# Patient Record
Sex: Female | Born: 1985 | Race: Black or African American | Hispanic: No | Marital: Single | Smoking: Never smoker
Health system: Southern US, Community
[De-identification: ages and names within clinical notes are randomized; demographics above are authoritative.]

## PROBLEM LIST (undated history)

## (undated) DIAGNOSIS — F29 Unspecified psychosis not due to a substance or known physiological condition: Secondary | ICD-10-CM

## (undated) DIAGNOSIS — F1410 Cocaine abuse, uncomplicated: Secondary | ICD-10-CM

## (undated) DIAGNOSIS — T1491XA Suicide attempt, initial encounter: Secondary | ICD-10-CM

---

## 2006-11-29 HISTORY — PX: EXPLORATORY LAPAROTOMY: SUR591

## 2019-07-17 ENCOUNTER — Emergency Department: Admission: EM | Admit: 2019-07-17 | Discharge: 2019-07-17 | Discharge status: Against Medical Advice | Payer: Self-pay

## 2019-07-18 ENCOUNTER — Encounter: Payer: Self-pay | Admitting: Emergency Medicine

## 2019-07-18 ENCOUNTER — Emergency Department
Admission: EM | Admit: 2019-07-18 | Discharge: 2019-07-18 | Disposition: A | Discharge status: Home / Self Care | Payer: Self-pay | Attending: Student | Admitting: Student

## 2019-07-18 ENCOUNTER — Other Ambulatory Visit: Payer: Self-pay

## 2019-07-18 DIAGNOSIS — N3001 Acute cystitis with hematuria: Secondary | ICD-10-CM

## 2019-07-18 LAB — URINALYSIS, COMPLETE (UACMP) WITH MICROSCOPIC
Bacteria, UA: NONE SEEN
Bilirubin Urine: NEGATIVE
Glucose, UA: NEGATIVE mg/dL
Hgb urine dipstick: NEGATIVE
Ketones, ur: NEGATIVE mg/dL
Nitrite: NEGATIVE
Protein, ur: 30 mg/dL — AB
Specific Gravity, Urine: 1.021 (ref 1.005–1.030)
WBC, UA: >50 WBC/hpf — ABNORMAL HIGH (ref 0–5)
pH: 5 (ref 5.0–8.0)

## 2019-07-18 LAB — POCT PREGNANCY, URINE: Preg Test, Ur: NEGATIVE

## 2019-07-18 MED ORDER — SULFAMETHOXAZOLE-TRIMETHOPRIM 800-160 MG PO TABS: 1.0000 | ORAL_TABLET | Freq: Two times a day (BID) | ORAL | 0 refills | Status: DC

## 2019-07-18 NOTE — ED Notes (Signed)
See triage note  Presents with 2-3 day hx of urinary freq   States she doesn't feel like she is emptying her bladder  Denies any fever or vaginal discharge

## 2019-07-18 NOTE — Discharge Instructions (Addendum)
Follow-up with your primary care provider if any continued problems.  Begin taking Bactrim DS twice daily for 10 days.  Do not stop until you have completely finished the antibiotic.  Increase fluids.  Tylenol or ibuprofen if needed for pain.  Return to the emergency department if any fever, chills, nausea or inability to take the antibiotic.  Also the [facility] is available to you at no charge.

## 2019-07-18 NOTE — ED Triage Notes (Signed)
Pt states she isn't emptying her bladder fully when she urinates, denies burning with urination, denies vaginal itching. Nad.

## 2019-07-18 NOTE — ED Provider Notes (Signed)
[facility] Emergency Department Provider Note   ____________________________________________   First MD Initiated Contact with Patient 07/18/19 0732     (approximate)  I have reviewed the triage vital signs and the nursing notes.   HISTORY  Chief Complaint Urinary Tract Infection   HPI Erica Park is a 33 y.o. female presents to the ED with complaint of sensation that she is not emptying her bladder fully.  Patient denies any fever, chills, nausea or vomiting.  She denies any vaginal discharge.  Patient states that she has had urinary tract infections before the last 1 being 1 year ago.  She rates her pain as a 0/10.     History reviewed. No pertinent past medical history.  There are no problems to display for this patient.   Past Surgical History:  Procedure Laterality Date  . ABDOMINAL SURGERY      Prior to Admission medications   Medication Sig Start Date End Date Taking? Authorizing Provider  sulfamethoxazole-trimethoprim (BACTRIM DS) 800-160 MG tablet Take 1 tablet by mouth 2 (two) times daily. 07/18/19   Tommi Rumps, PA-C    Allergies Patient has no known allergies.  No family history on file.  Social History Social History   Tobacco Use  . Smoking status: Never Smoker  . Smokeless tobacco: Never Used  Substance Use Topics  . Alcohol use: Yes    Comment: 1 beer every day  . Drug use: Not on file    Review of Systems Constitutional: No fever/chills Cardiovascular: Denies chest pain. Respiratory: Denies shortness of breath. Gastrointestinal: No abdominal pain.  No nausea, no vomiting.  Genitourinary: Positive for dysuria.  Negative for vaginal discharge. Musculoskeletal: Negative for back pain. Skin: Negative for rash. Neurological: Negative for headaches, focal weakness or numbness. ____________________________________________   PHYSICAL EXAM:  VITAL SIGNS: ED Triage Vitals  Enc Vitals Group    BP 07/18/19 0655 140/70     Pulse Rate 07/18/19 0655 94     Resp 07/18/19 0655 18     Temp 07/18/19 0655 99.1 F (37.3 C)     Temp Source 07/18/19 0655 Oral     SpO2 07/18/19 0655 100 %     Weight 07/18/19 0655 100 lb (45.4 kg)     Height 07/18/19 0655 5\' 4"  (1.626 m)     Head Circumference --      Peak Flow --      Pain Score 07/18/19 0716 0     Pain Loc --      Pain Edu? --      Excl. in [place]? --     Constitutional: Alert and oriented. Well appearing and in no acute distress. Eyes: Conjunctivae are normal.  Head: Atraumatic. Neck: No stridor.   Cardiovascular: Normal rate, regular rhythm. Grossly normal heart sounds.  Good peripheral circulation. Respiratory: Normal respiratory effort.  No retractions. Lungs CTAB. Gastrointestinal: Soft and nontender. No distention. No CVA tenderness. Musculoskeletal: Moves upper and lower extremities that any difficulty.  Normal gait was noted. Neurologic:  Normal speech and language. No gross focal neurologic deficits are appreciated. No gait instability. Skin:  Skin is warm, dry and intact. No rash noted. Psychiatric: Mood and affect are normal. Speech and behavior are normal.  ____________________________________________   LABS (all labs ordered are listed, but only abnormal results are displayed)  Labs Reviewed  URINALYSIS, COMPLETE (UACMP) WITH MICROSCOPIC - Abnormal; Notable for the following components:      Result Value   Color, Urine  YELLOW (*)    APPearance CLOUDY (*)    Protein, ur 30 (*)    Leukocytes,Ua MODERATE (*)    WBC, UA >50 (*)    Non Squamous Epithelial PRESENT (*)    All other components within normal limits  URINE CULTURE  POC URINE PREG, ED  POCT PREGNANCY, URINE     PROCEDURES  Procedure(s) performed (including Critical Care):  Procedures ____________________________________________   INITIAL IMPRESSION / ASSESSMENT AND PLAN / ED COURSE  As part of my medical decision making, I reviewed the  following data within the electronic MEDICAL RECORD NUMBER Notes from prior ED visits and [place] Controlled Substance Database  33 year old female presents to the ED with complaint of urinary frequency for last 2 to 3 days.  She also feels like she is not emptying her bladder fully.  She denies any burning, vaginal itching or vaginal discharge.  Patient has had a UTI 1 year ago and cleared without any complications.  Urinalysis today shows moderate leukocytes with WBC clumps present.  Patient was given a prescription for Bactrim DS twice daily for 10 days.  She is encouraged to increase fluids.  She will follow-up with her PCP if any continued problems or return to the emergency department if any worsening.   ____________________________________________   FINAL CLINICAL IMPRESSION(S) / ED DIAGNOSES  Final diagnoses:  Acute cystitis with hematuria     ED Discharge Orders         Ordered    sulfamethoxazole-trimethoprim (BACTRIM DS) 800-160 MG tablet  2 times daily     07/18/19 0809           Note:  This document was prepared using Dragon voice recognition software and may include unintentional dictation errors.    Johnn Hai, PA-C 07/18/19 1514    Lilia Pro., MD 07/18/19 [phone]

## 2019-07-18 NOTE — ED Notes (Signed)
Bladder scan preformed. 42mL of urine in bladder. Rhonda,PA notified about results.

## 2019-07-20 LAB — URINE CULTURE
Culture: >=100000 — AB
Special Requests: NORMAL

## 2019-07-21 NOTE — Progress Notes (Signed)
Brief Pharmacy Note  Patient is a 33 y/o female with history of UTIs who presented to [facility] ED with chief complaint of urinary tract infection. Patient endorsed sensation of not emptying bladder completely. Pregnancy test negative. No drug allergies. UA with no bacteria seen, 11-20 RBC, 6-10 squamous cells, >50 WBC. Patient discharged on TMP/SMX. Urine culture from ED visit has resulted >100k colonies/mL E coli (pan-sensitive). Discharge antibiotic with activity against identified bacteria.   [place] Resident 21 July 2019

## 2019-11-07 ENCOUNTER — Other Ambulatory Visit: Payer: Self-pay

## 2019-11-07 ENCOUNTER — Emergency Department
Admission: EM | Admit: 2019-11-07 | Discharge: 2019-11-07 | Disposition: A | Discharge status: Home / Self Care | Payer: Self-pay | Attending: Emergency Medicine | Admitting: Emergency Medicine

## 2019-11-07 DIAGNOSIS — N72 Inflammatory disease of cervix uteri: Secondary | ICD-10-CM | POA: Insufficient documentation

## 2019-11-07 DIAGNOSIS — N898 Other specified noninflammatory disorders of vagina: Secondary | ICD-10-CM | POA: Insufficient documentation

## 2019-11-07 LAB — WET PREP, GENITAL
Clue Cells Wet Prep HPF POC: NONE SEEN
Sperm: NONE SEEN
Trich, Wet Prep: NONE SEEN
Yeast Wet Prep HPF POC: NONE SEEN

## 2019-11-07 LAB — URINALYSIS, ROUTINE W REFLEX MICROSCOPIC
Bacteria, UA: NONE SEEN
Bilirubin Urine: NEGATIVE
Glucose, UA: NEGATIVE mg/dL
Hgb urine dipstick: NEGATIVE
Ketones, ur: NEGATIVE mg/dL
Nitrite: NEGATIVE
Protein, ur: NEGATIVE mg/dL
Specific Gravity, Urine: 1.008 (ref 1.005–1.030)
pH: 7 (ref 5.0–8.0)

## 2019-11-07 LAB — POCT PREGNANCY, URINE: Preg Test, Ur: NEGATIVE

## 2019-11-07 LAB — CHLAMYDIA/NGC RT PCR (ARMC ONLY)
Chlamydia Tr: NOT DETECTED
N gonorrhoeae: NOT DETECTED

## 2019-11-07 MED ORDER — AZITHROMYCIN 1 G PO PACK: 1.0000 g | PACK | Freq: Once | ORAL | Status: DC

## 2019-11-07 MED ORDER — AZITHROMYCIN 500 MG PO TABS
ORAL_TABLET | ORAL | Status: AC
  Administered 2019-11-07: 1000 mg
  Filled 2019-11-07: qty 2

## 2019-11-07 MED ORDER — CEFTRIAXONE SODIUM 1 G IJ SOLR
500.0000 mg | Freq: Once | INTRAMUSCULAR | Status: AC
  Administered 2019-11-07: 500 mg via INTRAMUSCULAR
  Filled 2019-11-07: qty 10

## 2019-11-07 NOTE — ED Notes (Signed)
See triage note, pt reports vaginal irritation for unknown time. Reports receiving antibiotic prescription here for UTI and not finishing it due to feeling better. Reports white discharge with foul odor and some burning.

## 2019-11-07 NOTE — ED Provider Notes (Signed)
[facility] Emergency Department Provider Note   ____________________________________________   First MD Initiated Contact with Patient 11/07/19 1814     (approximate)  I have reviewed the triage vital signs and the nursing notes.   HISTORY  Chief Complaint Vaginal Discharge    HPI Erica Park is a 34 y.o. female with no significant past medical history who presents to the ED complaining of vaginal discharge.  Patient reports that she has noticed whitish vaginal discharge for the past 3 to 3 days as well as vaginal discomfort and irritation.  She has not noticed any bleeding and denies any abdominal or flank pain.  She is concerned because she did not complete UTI treatment a few months prior, but she has not had any recent dysuria or hematuria.  She is currently sexually active.        History reviewed. No pertinent past medical history.  There are no problems to display for this patient.   Past Surgical History:  Procedure Laterality Date  . ABDOMINAL SURGERY      Prior to Admission medications   Not on File    Allergies Patient has no known allergies.  No family history on file.  Social History Social History   Tobacco Use  . Smoking status: Never Smoker  . Smokeless tobacco: Never Used  Substance Use Topics  . Alcohol use: Yes    Comment: 1 beer every day  . Drug use: Not on file    Review of Systems  Constitutional: No fever/chills Eyes: No visual changes. ENT: No sore throat. Cardiovascular: Denies chest pain. Respiratory: Denies shortness of breath. Gastrointestinal: No abdominal pain.  No nausea, no vomiting.  No diarrhea.  No constipation. Genitourinary: Negative for dysuria.  Positive for vaginal discharge. Musculoskeletal: Negative for back pain. Skin: Negative for rash. Neurological: Negative for headaches, focal weakness or numbness.  ____________________________________________   PHYSICAL  EXAM:  VITAL SIGNS: ED Triage Vitals  Enc Vitals Group     BP 11/07/19 1756 103/72     Pulse Rate 11/07/19 1756 83     Resp 11/07/19 1756 16     Temp 11/07/19 1756 99.2 F (37.3 C)     Temp Source 11/07/19 1756 Oral     SpO2 11/07/19 1756 100 %     Weight 11/07/19 1757 105 lb (47.6 kg)     Height 11/07/19 1757 5\' 4"  (1.626 m)     Head Circumference --      Peak Flow --      Pain Score 11/07/19 1757 0     Pain Loc --      Pain Edu? --      Excl. in [place]? --     Constitutional: Alert and oriented. Eyes: Conjunctivae are normal. Head: Atraumatic. Nose: No congestion/rhinnorhea. Mouth/Throat: Mucous membranes are moist. Neck: Normal ROM Cardiovascular: Normal rate, regular rhythm. Grossly normal heart sounds. Respiratory: Normal respiratory effort.  No retractions. Lungs CTAB. Gastrointestinal: Soft and nontender. No distention. Genitourinary: Whitish discharge noted from cervical os with no cervical motion adnexal tenderness. Musculoskeletal: No lower extremity tenderness nor edema. Neurologic:  Normal speech and language. No gross focal neurologic deficits are appreciated. Skin:  Skin is warm, dry and intact. No rash noted. Psychiatric: Mood and affect are normal. Speech and behavior are normal.  ____________________________________________   LABS (all labs ordered are listed, but only abnormal results are displayed)  Labs Reviewed  WET PREP, GENITAL - Abnormal; Notable for the following components:  Result Value   WBC, Wet Prep HPF POC MODERATE (*)    All other components within normal limits  URINALYSIS, ROUTINE W REFLEX MICROSCOPIC - Abnormal; Notable for the following components:   Color, Urine STRAW (*)    APPearance CLEAR (*)    Leukocytes,Ua TRACE (*)    All other components within normal limits  CHLAMYDIA/NGC RT PCR ([facility] ONLY)  POC URINE PREG, ED  POCT PREGNANCY, URINE    PROCEDURES  Procedure(s) performed (including Critical  Care):  Procedures   ____________________________________________   INITIAL IMPRESSION / ASSESSMENT AND PLAN / ED COURSE       34 year old female presents to the ED with 2 3 days of vaginal discharge and irritation.  Pelvic exam shows evidence of cervicitis but no cervical motion or next tenderness to suggest PID.  We will treat empirically for STDs, UA without evidence of UTI.  Wet prep is unremarkable.  Patient counseled to avoid sex for 7 days and have her partners treated if testing is positive.  She was counseled to return to the ED for new or worsening symptoms, patient agrees with plan.      ____________________________________________   FINAL CLINICAL IMPRESSION(S) / ED DIAGNOSES  Final diagnoses:  Vaginal discharge  Cervicitis     ED Discharge Orders    None       Note:  This document was prepared using Dragon voice recognition software and may include unintentional dictation errors.   Chesley Noon, MD 11/07/19 [phone]

## 2019-11-07 NOTE — ED Triage Notes (Signed)
Reports vaginal irritation, burning, abnormal discharge. Pt had recent UTI and did not finish ABX.

## 2020-03-24 ENCOUNTER — Other Ambulatory Visit: Payer: Self-pay

## 2020-03-24 DIAGNOSIS — R109 Unspecified abdominal pain: Secondary | ICD-10-CM | POA: Insufficient documentation

## 2020-03-24 DIAGNOSIS — Z5321 Procedure and treatment not carried out due to patient leaving prior to being seen by health care provider: Secondary | ICD-10-CM | POA: Insufficient documentation

## 2020-03-24 LAB — CBC
HCT: 39.2 % (ref 36.0–46.0)
Hemoglobin: 12.7 g/dL (ref 12.0–15.0)
MCH: 30.6 pg (ref 26.0–34.0)
MCHC: 32.4 g/dL (ref 30.0–36.0)
MCV: 94.5 fL (ref 80.0–100.0)
Platelets: 345 10*3/uL (ref 150–400)
RBC: 4.15 MIL/uL (ref 3.87–5.11)
RDW: 12.6 % (ref 11.5–15.5)
WBC: 16 10*3/uL — ABNORMAL HIGH (ref 4.0–10.5)
nRBC: 0 % (ref 0.0–0.2)

## 2020-03-24 LAB — COMPREHENSIVE METABOLIC PANEL
ALT: 9 U/L (ref 0–44)
AST: 16 U/L (ref 15–41)
Albumin: 4.8 g/dL (ref 3.5–5.0)
Alkaline Phosphatase: 35 U/L — ABNORMAL LOW (ref 38–126)
Anion gap: 15 (ref 5–15)
BUN: 10 mg/dL (ref 6–20)
CO2: 23 mmol/L (ref 22–32)
Calcium: 9.4 mg/dL (ref 8.9–10.3)
Chloride: 100 mmol/L (ref 98–111)
Creatinine, Ser: 0.62 mg/dL (ref 0.44–1.00)
GFR calc Af Amer: >60 mL/min (ref >60)
GFR calc non Af Amer: >60 mL/min (ref >60)
Glucose, Bld: 139 mg/dL — ABNORMAL HIGH (ref 70–99)
Potassium: 2.9 mmol/L — ABNORMAL LOW (ref 3.5–5.1)
Sodium: 138 mmol/L (ref 135–145)
Total Bilirubin: 1.3 mg/dL — ABNORMAL HIGH (ref 0.3–1.2)
Total Protein: 8 g/dL (ref 6.5–8.1)

## 2020-03-24 LAB — LIPASE, BLOOD: Lipase: 22 U/L (ref 11–51)

## 2020-03-24 MED ORDER — ONDANSETRON 4 MG PO TBDP
4.0000 mg | ORAL_TABLET | Freq: Once | ORAL | Status: AC
  Administered 2020-03-24: 4 mg via ORAL

## 2020-03-24 MED ORDER — ONDANSETRON 4 MG PO TBDP
ORAL_TABLET | ORAL | Status: AC
  Filled 2020-03-24: qty 1

## 2020-03-24 NOTE — ED Notes (Signed)
Pt vomited x3 green bile appearnig emesis in triage.

## 2020-03-24 NOTE — ED Triage Notes (Signed)
See first nurse note. Pt cooperative for this RN. Pt states abdominal pain and n/v for "hours".

## 2020-03-24 NOTE — ED Notes (Signed)
Pt refusing EKG at this time. States that she can't move her position.

## 2020-03-24 NOTE — ED Notes (Signed)
First Nurse Note:  Patient coming [facility] for abdominal pain. Patient noncompliant with most of EMS requests. Patient would not allow EMS to vitalize her. Patient spitting at EMS staff. Patient tried to defecate in ambulance.

## 2020-03-25 ENCOUNTER — Other Ambulatory Visit: Payer: Self-pay

## 2020-03-25 ENCOUNTER — Inpatient Hospital Stay
Admission: EM | Admit: 2020-03-25 | Discharge: 2020-03-28 | DRG: 390 | Disposition: A | Discharge status: Home / Self Care | Payer: Self-pay | Attending: Internal Medicine | Admitting: Internal Medicine

## 2020-03-25 ENCOUNTER — Encounter: Payer: Self-pay | Admitting: Emergency Medicine

## 2020-03-25 ENCOUNTER — Emergency Department: Payer: Self-pay

## 2020-03-25 ENCOUNTER — Emergency Department
Admission: EM | Admit: 2020-03-25 | Discharge: 2020-03-25 | Disposition: A | Discharge status: Home / Self Care | Payer: Self-pay | Attending: Emergency Medicine | Admitting: Emergency Medicine

## 2020-03-25 DIAGNOSIS — Z915 Personal history of self-harm: Secondary | ICD-10-CM

## 2020-03-25 DIAGNOSIS — K565 Intestinal adhesions [bands], unspecified as to partial versus complete obstruction: Principal | ICD-10-CM | POA: Diagnosis present

## 2020-03-25 DIAGNOSIS — R000 Tachycardia, unspecified: Secondary | ICD-10-CM | POA: Diagnosis present

## 2020-03-25 DIAGNOSIS — R918 Other nonspecific abnormal finding of lung field: Secondary | ICD-10-CM

## 2020-03-25 DIAGNOSIS — K56609 Unspecified intestinal obstruction, unspecified as to partial versus complete obstruction: Secondary | ICD-10-CM | POA: Diagnosis present

## 2020-03-25 DIAGNOSIS — R1084 Generalized abdominal pain: Secondary | ICD-10-CM

## 2020-03-25 DIAGNOSIS — F250 Schizoaffective disorder, bipolar type: Secondary | ICD-10-CM | POA: Diagnosis present

## 2020-03-25 DIAGNOSIS — Z20822 Contact with and (suspected) exposure to covid-19: Secondary | ICD-10-CM | POA: Diagnosis present

## 2020-03-25 DIAGNOSIS — Z5320 Procedure and treatment not carried out because of patient's decision for unspecified reasons: Secondary | ICD-10-CM | POA: Diagnosis present

## 2020-03-25 DIAGNOSIS — F1410 Cocaine abuse, uncomplicated: Secondary | ICD-10-CM | POA: Diagnosis present

## 2020-03-25 HISTORY — DX: Unspecified psychosis not due to a substance or known physiological condition: F29

## 2020-03-25 HISTORY — DX: Cocaine abuse, uncomplicated: F14.10

## 2020-03-25 HISTORY — DX: Suicide attempt, initial encounter: T14.91XA

## 2020-03-25 LAB — URINE DRUG SCREEN, QUALITATIVE (ARMC ONLY)
Amphetamines, Ur Screen: NOT DETECTED
Barbiturates, Ur Screen: NOT DETECTED
Benzodiazepine, Ur Scrn: NOT DETECTED
Cannabinoid 50 Ng, Ur ~~LOC~~: NOT DETECTED
Cocaine Metabolite,Ur ~~LOC~~: POSITIVE — AB
MDMA (Ecstasy)Ur Screen: NOT DETECTED
Methadone Scn, Ur: NOT DETECTED
Opiate, Ur Screen: NOT DETECTED
Phencyclidine (PCP) Ur S: NOT DETECTED
Tricyclic, Ur Screen: NOT DETECTED

## 2020-03-25 LAB — COMPREHENSIVE METABOLIC PANEL
ALT: 11 U/L (ref 0–44)
AST: 16 U/L (ref 15–41)
Albumin: 5.1 g/dL — ABNORMAL HIGH (ref 3.5–5.0)
Alkaline Phosphatase: 40 U/L (ref 38–126)
Anion gap: 16 — ABNORMAL HIGH (ref 5–15)
BUN: 14 mg/dL (ref 6–20)
CO2: 25 mmol/L (ref 22–32)
Calcium: 9.7 mg/dL (ref 8.9–10.3)
Chloride: 98 mmol/L (ref 98–111)
Creatinine, Ser: 0.61 mg/dL (ref 0.44–1.00)
GFR calc Af Amer: >60 mL/min (ref >60)
GFR calc non Af Amer: >60 mL/min (ref >60)
Glucose, Bld: 98 mg/dL (ref 70–99)
Potassium: 3.1 mmol/L — ABNORMAL LOW (ref 3.5–5.1)
Sodium: 139 mmol/L (ref 135–145)
Total Bilirubin: 1.9 mg/dL — ABNORMAL HIGH (ref 0.3–1.2)
Total Protein: 8.7 g/dL — ABNORMAL HIGH (ref 6.5–8.1)

## 2020-03-25 LAB — URINALYSIS, COMPLETE (UACMP) WITH MICROSCOPIC
Glucose, UA: 50 mg/dL — AB
Hgb urine dipstick: NEGATIVE
Ketones, ur: 80 mg/dL — AB
Leukocytes,Ua: NEGATIVE
Nitrite: NEGATIVE
Protein, ur: 100 mg/dL — AB
Specific Gravity, Urine: 1.035 — ABNORMAL HIGH (ref 1.005–1.030)
pH: 5 (ref 5.0–8.0)

## 2020-03-25 LAB — CBC
HCT: 41.1 % (ref 36.0–46.0)
Hemoglobin: 13.6 g/dL (ref 12.0–15.0)
MCH: 30.7 pg (ref 26.0–34.0)
MCHC: 33.1 g/dL (ref 30.0–36.0)
MCV: 92.8 fL (ref 80.0–100.0)
Platelets: 365 10*3/uL (ref 150–400)
RBC: 4.43 MIL/uL (ref 3.87–5.11)
RDW: 12.7 % (ref 11.5–15.5)
WBC: 14 10*3/uL — ABNORMAL HIGH (ref 4.0–10.5)
nRBC: 0 % (ref 0.0–0.2)

## 2020-03-25 LAB — SARS CORONAVIRUS 2 BY RT PCR (HOSPITAL ORDER, PERFORMED IN ~~LOC~~ HOSPITAL LAB): SARS Coronavirus 2: NEGATIVE

## 2020-03-25 LAB — HCG, QUANTITATIVE, PREGNANCY: hCG, Beta Chain, Quant, S: 1 m[IU]/mL (ref <5)

## 2020-03-25 LAB — TROPONIN I (HIGH SENSITIVITY): Troponin I (High Sensitivity): <2 ng/L (ref <18)

## 2020-03-25 LAB — LIPASE, BLOOD: Lipase: 21 U/L (ref 11–51)

## 2020-03-25 MED ORDER — LORAZEPAM 2 MG/ML IJ SOLN
2.0000 mg | Freq: Once | INTRAMUSCULAR | Status: AC
  Administered 2020-03-26: 2 mg via INTRAVENOUS
  Filled 2020-03-25: qty 1

## 2020-03-25 MED ORDER — ONDANSETRON HCL 4 MG/2ML IJ SOLN: 4.0000 mg | Freq: Four times a day (QID) | INTRAMUSCULAR | Status: DC | PRN

## 2020-03-25 MED ORDER — ENOXAPARIN SODIUM 40 MG/0.4ML ~~LOC~~ SOLN: 40.0000 mg | SUBCUTANEOUS | Status: DC

## 2020-03-25 MED ORDER — LACTATED RINGERS IV SOLN: INTRAVENOUS | Status: DC

## 2020-03-25 MED ORDER — ACETAMINOPHEN 325 MG PO TABS: 650.0000 mg | ORAL_TABLET | Freq: Four times a day (QID) | ORAL | Status: DC | PRN

## 2020-03-25 MED ORDER — PIPERACILLIN-TAZOBACTAM 3.375 G IVPB
3.3750 g | Freq: Three times a day (TID) | INTRAVENOUS | Status: DC
  Filled 2020-03-25: qty 50

## 2020-03-25 MED ORDER — ONDANSETRON HCL 4 MG PO TABS: 4.0000 mg | ORAL_TABLET | Freq: Four times a day (QID) | ORAL | Status: DC | PRN

## 2020-03-25 MED ORDER — SODIUM CHLORIDE 0.9 % IV BOLUS
1000.0000 mL | Freq: Once | INTRAVENOUS | Status: AC
  Administered 2020-03-25: 1000 mL via INTRAVENOUS

## 2020-03-25 MED ORDER — PIPERACILLIN-TAZOBACTAM 3.375 G IVPB 30 MIN: 3.3750 g | Freq: Three times a day (TID) | INTRAVENOUS | Status: DC

## 2020-03-25 MED ORDER — IOHEXOL 300 MG/ML  SOLN
75.0000 mL | Freq: Once | INTRAMUSCULAR | Status: AC | PRN
  Administered 2020-03-25: 75 mL via INTRAVENOUS
  Filled 2020-03-25: qty 75

## 2020-03-25 MED ORDER — MORPHINE SULFATE (PF) 2 MG/ML IV SOLN
2.0000 mg | Freq: Once | INTRAVENOUS | Status: AC
  Administered 2020-03-25: 2 mg via INTRAVENOUS
  Filled 2020-03-25: qty 1

## 2020-03-25 MED ORDER — ONDANSETRON 4 MG PO TBDP
4.0000 mg | ORAL_TABLET | Freq: Once | ORAL | Status: AC | PRN
  Administered 2020-03-25: 4 mg via ORAL
  Filled 2020-03-25: qty 1

## 2020-03-25 MED ORDER — MORPHINE SULFATE (PF) 2 MG/ML IV SOLN
2.0000 mg | INTRAVENOUS | Status: DC | PRN
  Administered 2020-03-26: 2 mg via INTRAVENOUS
  Filled 2020-03-25: qty 1

## 2020-03-25 MED ORDER — OXYCODONE-ACETAMINOPHEN 5-325 MG PO TABS: 1.0000 | ORAL_TABLET | Freq: Once | ORAL | Status: DC

## 2020-03-25 MED ORDER — POTASSIUM CHLORIDE CRYS ER 20 MEQ PO TBCR
40.0000 meq | EXTENDED_RELEASE_TABLET | Freq: Once | ORAL | Status: AC
  Administered 2020-03-25: 40 meq via ORAL
  Filled 2020-03-25: qty 2

## 2020-03-25 MED ORDER — PIPERACILLIN-TAZOBACTAM 3.375 G IVPB
3.3750 g | Freq: Three times a day (TID) | INTRAVENOUS | Status: DC
  Administered 2020-03-26 (×3): 3.375 g via INTRAVENOUS
  Filled 2020-03-25 (×2): qty 50

## 2020-03-25 MED ORDER — ACETAMINOPHEN 650 MG RE SUPP: 650.0000 mg | Freq: Four times a day (QID) | RECTAL | Status: DC | PRN

## 2020-03-25 NOTE — ED Triage Notes (Signed)
Pt called from [facility] to treatment room, no response 

## 2020-03-25 NOTE — ED Notes (Addendum)
Pt at stat desk asking for water. Pt stated "I am severely dehydrated and dry I need some water." Pt was informed that RN would be up here to talk to her shortly. This tech explained to pt to hold off right now d/t coming in c/o abd pain and vomiting. Pt stated "I don't have abd pain anymore. I am dry and dehydrated." Pt again informed that RN would be out shortly to talk to her. Pt continues to stand at stat desk.

## 2020-03-25 NOTE — H&P (Signed)
History and Physical    Erica Park GBT:[id number] DOB: 05-Dec-1985 DOA: 03/25/2020  PCP: Patient, No Pcp Per   Patient coming from: Home  I have personally briefly reviewed patient's old medical records in [facility]  Chief Complaint: abdominal pain  HPI: Erica Park is a 34 y.o. female with medical history significant for laparotomy who presents with diffuse abdominal pain associated with vomiting and diarrhea.  Presented to the emergency room yesterday but left without being seen due to prolonged wait times.  She denies dysuria or vaginal discomfort.  Denies fever and chills. ED Course: On arrival in the emergency room she was afebrile, borderline tachycardia of 91, BP 108/88 with otherwise normal vitals.  However what is what you choose selecting from the cafeteria.  WBC 14,000 with potassium 3.1 but otherwise unremarkable blood work.  Lipase normal, urinalysis with 80 ketones but no pyuria.  CT abdomen showed small bowel obstruction with transition point in the central abdomen.  The emergency room provider spoke with surgeon, Dr. Aleen Campi who requested hospitalist admission.  NG tube placed.  Hospitalist consulted for admission.  Of note there was an incidental finding of minimal patchy groundglass airspace disease in the right middle lobe suspicious for bronchiolitis or pneumonia.  Covid test however negative.  Review of Systems: As per HPI otherwise all other systems on review of systems negative.    History reviewed. No pertinent past medical history.  Past Surgical History:  Procedure Laterality Date  . ABDOMINAL SURGERY       reports that she has never smoked. She has never used smokeless tobacco. She reports current alcohol use. She reports current drug use. Drug: Cocaine.  No Known Allergies  History reviewed. No pertinent family history.    Prior to Admission medications   Not on File    Physical Exam: Vitals:   03/25/20 1401  03/25/20 1403  BP: 108/88   Pulse: 91   Resp: 20   Temp: 98.8 F (37.1 C)   TempSrc: Oral   SpO2: 98%   Weight:  44 kg  Height:  5\' 4"  (1.626 m)     Vitals:   03/25/20 1401 03/25/20 1403  BP: 108/88   Pulse: 91   Resp: 20   Temp: 98.8 F (37.1 C)   TempSrc: Oral   SpO2: 98%   Weight:  44 kg  Height:  5\' 4"  (1.626 m)      Constitutional: Alert and oriented x 3 .  Patient having intermittent colicky abdominal pain during examination  HEENT:      Head: Normocephalic and atraumatic.         Eyes: PERLA, EOMI, Conjunctivae are normal. Sclera is non-icteric.       Mouth/Throat: Mucous membranes are moist.       Neck: Supple with no signs of meningismus. Cardiovascular: Regular rate and rhythm. No murmurs, gallops, or rubs. 2+ symmetrical distal pulses are present . No JVD. No LE edema Respiratory: Respiratory effort normal .Lungs sounds clear bilaterally. No wheezes, crackles, or rhonchi.  Gastrointestinal:  Midline laparotomy scar.  Tender in periumbilical area genitourinary: No CVA tenderness. Musculoskeletal: Nontender with normal range of motion in all extremities. No cyanosis, or erythema of extremities. Neurologic:  Face is symmetric. Moving all extremities. No gross focal neurologic deficits . Skin: Skin is warm, dry.  No rash or ulcers Psychiatric: Appears anxious  Labs on Admission: I have personally reviewed following labs and imaging studies  CBC: Recent Labs  Lab  03/24/20 2057 03/25/20 1406  WBC 16.0* 14.0*  HGB 12.7 13.6  HCT 39.2 41.1  MCV 94.5 92.8  PLT 345 365   Basic Metabolic Panel: Recent Labs  Lab 03/24/20 2057 03/25/20 1406  NA 138 139  K 2.9* 3.1*  CL 100 98  CO2 23 25  GLUCOSE 139* 98  BUN 10 14  CREATININE 0.62 0.61  CALCIUM 9.4 9.7   GFR: Estimated Creatinine Clearance: 70.1 mL/min (by C-G formula based on SCr of 0.61 mg/dL). Liver Function Tests: Recent Labs  Lab 03/24/20 2057 03/25/20 1406  AST 16 16  ALT 9 11  ALKPHOS  35* 40  BILITOT 1.3* 1.9*  PROT 8.0 8.7*  ALBUMIN 4.8 5.1*   Recent Labs  Lab 03/24/20 2057 03/25/20 1406  LIPASE 22 21   No results for input(s): AMMONIA in the last 168 hours. Coagulation Profile: No results for input(s): INR, PROTIME in the last 168 hours. Cardiac Enzymes: No results for input(s): CKTOTAL, CKMB, CKMBINDEX, TROPONINI in the last 168 hours. BNP (last 3 results) No results for input(s): PROBNP in the last 8760 hours. HbA1C: No results for input(s): HGBA1C in the last 72 hours. CBG: No results for input(s): GLUCAP in the last 168 hours. Lipid Profile: No results for input(s): CHOL, HDL, LDLCALC, TRIG, CHOLHDL, LDLDIRECT in the last 72 hours. Thyroid Function Tests: No results for input(s): TSH, T4TOTAL, FREET4, T3FREE, THYROIDAB in the last 72 hours. Anemia Panel: No results for input(s): VITAMINB12, FOLATE, FERRITIN, TIBC, IRON, RETICCTPCT in the last 72 hours. Urine analysis:    Component Value Date/Time   COLORURINE AMBER (A) 03/25/2020 1800   APPEARANCEUR CLOUDY (A) 03/25/2020 1800   LABSPEC 1.035 (H) 03/25/2020 1800   PHURINE 5.0 03/25/2020 1800   GLUCOSEU 50 (A) 03/25/2020 1800   HGBUR NEGATIVE 03/25/2020 1800   BILIRUBINUR MODERATE (A) 03/25/2020 1800   KETONESUR 80 (A) 03/25/2020 1800   PROTEINUR 100 (A) 03/25/2020 1800   NITRITE NEGATIVE 03/25/2020 1800   LEUKOCYTESUR NEGATIVE 03/25/2020 1800    Radiological Exams on Admission: CT ABDOMEN PELVIS W CONTRAST  Result Date: 03/25/2020 CLINICAL DATA:  Generalized abdominal pain and fever. Nausea and vomiting. EXAM: CT ABDOMEN AND PELVIS WITH CONTRAST TECHNIQUE: Multidetector CT imaging of the abdomen and pelvis was performed using the standard protocol following bolus administration of intravenous contrast. CONTRAST:  46mL OMNIPAQUE IOHEXOL 300 MG/ML  SOLN COMPARISON:  None. FINDINGS: Lower chest: Minimal patchy ground-glass airspace disease in the right middle lobe. No pleural fluid. Hepatobiliary:  Homogeneous liver attenuation without focal abnormality. Small gallstones. No pericholecystic inflammation. No biliary dilatation. Pancreas: No ductal dilatation or inflammation. Spleen: Normal in size without focal abnormality. Adrenals/Urinary Tract: Normal adrenal glands. No hydronephrosis or perinephric edema. Homogeneous renal enhancement. Urinary bladder is nondistended and not well evaluated. Stomach/Bowel: Bowel evaluation is limited in the absence of enteric contrast and paucity of intra-abdominal fat. Fluid distended stomach without wall thickening. Proximal small bowel are dilated and fluid-filled. There is transition point in the central abdomen, series 2, image 47, series 5, image 28. In this region there is mild mesenteric swirling. No small bowel wall thickening, inflammatory change, or pneumatosis. There is minor mesenteric edema. The more distal small bowel are decompressed. Normal appendix. Majority of the colon is decompressed. Difficult to exclude colonic wall thickening. Vascular/Lymphatic: Normal caliber abdominal aorta. Patent portal vein. No portal or mesenteric gas. No enlarged lymph nodes in the abdomen or pelvis. Reproductive: Probable nabothian cysts in the cervix. Uterus is anteverted. There is a  3.2 cm simple cyst in the left ovary. To cystic structures in the right adnexa may represent ovarian or para ovarian cysts, 1 measuring 2.4 cm, 1 measuring 3.8 cm. There is a small amount of simple free fluid in the dependent pelvis. Other: Mild mesenteric edema. Small amount of free fluid in the dependent pelvis as well as adjacent to the bladder. No free air. Musculoskeletal: There are no acute or suspicious osseous abnormalities. IMPRESSION: 1. Small bowel obstruction with transition point in the central abdomen. This may be due to adhesions, however there is mild swirling in the mesentery at the site of obstruction which raises the possibility of internal hernia. No evidence of bowel  inflammation or ischemia. 2. Simple 3.2 cm left ovarian cyst. Cystic structures in the right adnexa may represent ovarian or para ovarian cysts, 1 measuring 2.4 cm, 1 measuring 3.8 cm. Small amount of simple free fluid in the pelvis. 3. Cholelithiasis without gallbladder inflammation. 4. Minimal patchy ground-glass airspace disease in the right middle lobe, suspicious for bronchiolitis or pneumonia. Electronically Signed   By: Narda Rutherford M.D.   On: 03/25/2020 20:11     Assessment/Plan 34 year old female medical history significant for laparotomy who presents with diffuse abdominal pain associated with vomiting and diarrhea.SBO on CT.    SBO (small bowel obstruction) (HCC) -NG tube to low intermittent suction -Consult surgery : Dr. Aleen Campi will do KUB in the a.m., per secure chat -IV pain medication IV antiemetics --empiric Zosyn -IV fluids    Abnormal finding on lung imaging -Patchy groundglass airspace disease in the right middle lobe suspicious for bronchiolitis or pneumonia -Covid PCR negative -Continue to monitor for respiratory symptoms    DVT prophylaxis: Lovenox  Code Status: full code  Family Communication:  none  Disposition Plan: Back to previous home environment Consults called: piscoya Status:At the time of admission, it appears that the appropriate admission status for this patient is INPATIENT. This is judged to be reasonable and necessary in order to provide the required intensity of service to ensure the patient's safety given the presenting symptoms, physical exam findings, and initial radiographic and laboratory data in the context of their  Comorbid conditions.   Patient requires inpatient status due to high intensity of service, high risk for further deterioration and high frequency of surveillance required.   I certify that at the point of admission it is my clinical judgment that the patient will require inpatient hospital care spanning beyond 2  midnights     Andris Baumann MD [facility]     03/25/2020, 9:53 PM

## 2020-03-25 NOTE — Progress Notes (Signed)
03/25/20  Received consult for SBO.  Patient has history of cocaine abuse, suicide attempts, psychosis, prior exploratory laparotomy in 2008, presenting with abdominal pain, nausea, vomiting.  She had presented last night but left before being seen.  Tonight, she had workup showing WBC of 14, Cr 0.61, K of 3.1, total bili of 1.9.  UDS positive for cocaine.  CT scan showing SBO with transition point in the mid abdomen.  Although there is some concern for possible swirl sign, there's no bowel thickening or stranding, and her WBC is actually lower than it was yesterday without any antibiotics.  NG tube being placed.  Have ordered KUB for the morning.  No acute surgical intervention needed at this point.  Will continue to follow.  Full consult to follow.  Henrene Dodge, MD

## 2020-03-25 NOTE — ED Triage Notes (Signed)
Pt presents to ED via [facility] with c/o generalized abdominal pain, N/V. Pt also c/o sharp pain to her L side. Pt LWBS after being triaged last night. Pt states same symptoms from last night, however states diarrhea has resolved. Pt with noted 1 episode of vomiting while in the lobby.

## 2020-03-25 NOTE — ED Notes (Addendum)
Pt offered ODT Zofran, pt refused ODT zofran in triage. Pt states she needed medication to "hydrate her tongue". Pt telling Genelle Bal, EDT to "hurry up" in regards to drawing her blood. Pt also states, "please get me in the back I can't take this pain".

## 2020-03-25 NOTE — ED Provider Notes (Addendum)
[facility] Emergency Department Provider Note  ____________________________________________   First MD Initiated Contact with Patient 03/25/20 1712     (approximate)  I have reviewed the triage vital signs and the nursing notes.   HISTORY  Chief Complaint Abdominal Pain    HPI Erica Park is a 34 y.o. female presents emergency department complaining of vague abdominal pain.  States she has had vomiting and diarrhea.  Left without being seen last night after being triaged.  No fever or chills.  No Covid vaccine.  States she is just hurting very badly in the abdomen.   She states that she needs pain medication now.  She denies vaginal discharge or UTI symptoms   Past Medical History:  Diagnosis Date  . Cocaine abuse (HCC)   . Psychosis (HCC)   . Suicide attempt [facility])     Patient Active Problem List   Diagnosis Date Noted  . SBO (small bowel obstruction) (HCC) 03/25/2020  . Abnormal finding on lung imaging 03/25/2020    Past Surgical History:  Procedure Laterality Date  . EXPLORATORY LAPAROTOMY  11/29/2006    Prior to Admission medications   Not on File    Allergies Patient has no known allergies.  History reviewed. No pertinent family history.  Social History Social History   Tobacco Use  . Smoking status: Never Smoker  . Smokeless tobacco: Never Used  Substance Use Topics  . Alcohol use: Yes    Comment: 1 beer every day  . Drug use: Yes    Types: Cocaine    Review of Systems  Constitutional: Positive fever/chills Eyes: No visual changes. ENT: No sore throat. Respiratory: Denies cough Cardiovascular: Denies chest pain Gastrointestinal: Positive abdominal pain Genitourinary: Negative for dysuria. Musculoskeletal: Negative for back pain. Skin: Negative for rash. Psychiatric: no mood changes,     ____________________________________________   PHYSICAL EXAM:  VITAL SIGNS: ED Triage Vitals  Enc  Vitals Group     BP 03/25/20 1401 108/88     Pulse Rate 03/25/20 1401 91     Resp 03/25/20 1401 20     Temp 03/25/20 1401 98.8 F (37.1 C)     Temp Source 03/25/20 1401 Oral     SpO2 03/25/20 1401 98 %     Weight 03/25/20 1403 97 lb (44 kg)     Height 03/25/20 1403 5\' 4"  (1.626 m)     Head Circumference --      Peak Flow --      Pain Score 03/25/20 1403 10     Pain Loc --      Pain Edu? --      Excl. in GC? --     Constitutional: Alert and oriented. Well appearing and in no acute distress. Eyes: Conjunctivae are normal.  Head: Atraumatic. Nose: No congestion/rhinnorhea. Mouth/Throat: Mucous membranes are moist.   Neck:  supple no lymphadenopathy noted Cardiovascular: Normal rate, regular rhythm. Heart sounds are normal Respiratory: Normal respiratory effort.  No retractions, lungs c t a  Abd: It is hard to determine with the abdominal exam, I had my hands hovering over the patient's abdomen and she is stating that it hurts, tender everywhere that I actually touched, I feel that the patient is not being honest about her pain.   GU: deferred Musculoskeletal: FROM all extremities, warm and well perfused Neurologic:  Normal speech and language.  Skin:  Skin is warm, dry and intact. No rash noted. Psychiatric: Mood and affect are normal. Speech and  behavior are normal.  ____________________________________________   LABS (all labs ordered are listed, but only abnormal results are displayed)  Labs Reviewed  COMPREHENSIVE METABOLIC PANEL - Abnormal; Notable for the following components:      Result Value   Potassium 3.1 (*)    Total Protein 8.7 (*)    Albumin 5.1 (*)    Total Bilirubin 1.9 (*)    Anion gap 16 (*)    All other components within normal limits  CBC - Abnormal; Notable for the following components:   WBC 14.0 (*)    All other components within normal limits  URINALYSIS, COMPLETE (UACMP) WITH MICROSCOPIC - Abnormal; Notable for the following components:    Color, Urine AMBER (*)    APPearance CLOUDY (*)    Specific Gravity, Urine 1.035 (*)    Glucose, UA 50 (*)    Bilirubin Urine MODERATE (*)    Ketones, ur 80 (*)    Protein, ur 100 (*)    Bacteria, UA RARE (*)    All other components within normal limits  URINE DRUG SCREEN, QUALITATIVE ([facility] ONLY) - Abnormal; Notable for the following components:   Cocaine Metabolite,Ur [place] POSITIVE (*)    All other components within normal limits  BASIC METABOLIC PANEL - Abnormal; Notable for the following components:   Sodium 134 (*)    Calcium 8.8 (*)    All other components within normal limits  CBC - Abnormal; Notable for the following components:   WBC 13.1 (*)    RBC 3.44 (*)    Hemoglobin 10.7 (*)    HCT 31.3 (*)    All other components within normal limits  SARS CORONAVIRUS 2 BY RT PCR (HOSPITAL ORDER, PERFORMED IN [facility])  LIPASE, BLOOD  HCG, QUANTITATIVE, PREGNANCY  HIV ANTIBODY (ROUTINE TESTING W REFLEX)   ____________________________________________   ____________________________________________  RADIOLOGY    ____________________________________________   PROCEDURES  Procedure(s) performed: No  Procedures    ____________________________________________   INITIAL IMPRESSION / ASSESSMENT AND PLAN / ED COURSE  Pertinent labs & imaging results that were available during my care of the patient were reviewed by me and considered in my medical decision making (see chart for details).   Patient is a 34 year old female with history of cocaine abuse and drug abuse along with schizoaffective disorder and bizarre behavior presents to the emergency department complaining of abdominal pain.  Patient left without being seen last night.  States that the diarrhea has gotten better but she still had some vomiting.  Did not get a Covid vaccine see HPI  Physical exam is difficult.  The patient is demanding pain medication.  When I hover my hand over her abdomen without  touching her she states it hurts.  She states it hurts when I touch her abdomen.  It makes it difficult to determine whether or not patient is being honest with exam and malingering versus actual pathology  DDx:  abdominal pain, gastroenteritis, Covid  CBC has elevated WBC of 14, pregnancy metabolic panel has decreased potassium 3.1, anion gap is at 16  I did order a beta for pregnancy, her lipase is normal, urinalysis needs to be collected along with a UDS.  I do think the patient needs fluids at this time.  She will be given 1 L normal saline, morphine 4 mg.  She is already has Zofran.  Most likely will do a CT to rule out any condition that may be serious at this time.  .  Care  transferred to J. Woods, PA-C  UDS showed cocaine, urinalysis showed moderate bili's, 80 ketones and rare bacteria  CT shows small bowel obstruction.    Erica Park was evaluated in Emergency Department on 03/26/2020 for the symptoms described in the history of present illness. She was evaluated in the context of the global COVID-19 pandemic, which necessitated consideration that the patient might be at risk for infection with the SARS-CoV-2 virus that causes COVID-19. Institutional protocols and algorithms that pertain to the evaluation of patients at risk for COVID-19 are in a state of rapid change based on information released by regulatory bodies including the CDC and federal and state organizations. These policies and algorithms were followed during the patient's care in the ED.    As part of my medical decision making, I reviewed the following data within the electronic MEDICAL RECORD NUMBER Nursing notes reviewed and incorporated, Labs reviewed , Old chart reviewed, Patient signed out to Erica Canary, PA-C, Notes from prior ED visits and [place] Controlled Substance Database  ____________________________________________   FINAL CLINICAL IMPRESSION(S) / ED DIAGNOSES  Final diagnoses:  Generalized  abdominal pain  Small bowel obstruction (HCC)      NEW MEDICATIONS STARTED DURING THIS VISIT:  New Prescriptions   No medications on file     Note:  This document was prepared using Dragon voice recognition software and may include unintentional dictation errors.    Faythe Ghee, PA-C 03/25/20 1911    Faythe Ghee, PA-C 03/26/20 1043    Shaune Pollack, MD 03/27/20 [phone]

## 2020-03-25 NOTE — ED Notes (Signed)
Pt given cup of water per request.

## 2020-03-26 ENCOUNTER — Inpatient Hospital Stay: Payer: Self-pay

## 2020-03-26 LAB — HIV ANTIBODY (ROUTINE TESTING W REFLEX): HIV Screen 4th Generation wRfx: NONREACTIVE

## 2020-03-26 LAB — CBC
HCT: 31.3 % — ABNORMAL LOW (ref 36.0–46.0)
Hemoglobin: 10.7 g/dL — ABNORMAL LOW (ref 12.0–15.0)
MCH: 31.1 pg (ref 26.0–34.0)
MCHC: 34.2 g/dL (ref 30.0–36.0)
MCV: 91 fL (ref 80.0–100.0)
Platelets: 259 10*3/uL (ref 150–400)
RBC: 3.44 MIL/uL — ABNORMAL LOW (ref 3.87–5.11)
RDW: 12.7 % (ref 11.5–15.5)
WBC: 13.1 10*3/uL — ABNORMAL HIGH (ref 4.0–10.5)
nRBC: 0 % (ref 0.0–0.2)

## 2020-03-26 LAB — BASIC METABOLIC PANEL
Anion gap: 7 (ref 5–15)
BUN: 7 mg/dL (ref 6–20)
CO2: 25 mmol/L (ref 22–32)
Calcium: 8.8 mg/dL — ABNORMAL LOW (ref 8.9–10.3)
Chloride: 102 mmol/L (ref 98–111)
Creatinine, Ser: 0.45 mg/dL (ref 0.44–1.00)
GFR calc Af Amer: >60 mL/min (ref >60)
GFR calc non Af Amer: >60 mL/min (ref >60)
Glucose, Bld: 87 mg/dL (ref 70–99)
Potassium: 3.6 mmol/L (ref 3.5–5.1)
Sodium: 134 mmol/L — ABNORMAL LOW (ref 135–145)

## 2020-03-26 MED ORDER — ENOXAPARIN SODIUM 30 MG/0.3ML ~~LOC~~ SOLN
30.0000 mg | SUBCUTANEOUS | Status: DC
  Administered 2020-03-26 – 2020-03-27 (×2): 30 mg via SUBCUTANEOUS
  Filled 2020-03-26 (×3): qty 0.3

## 2020-03-26 NOTE — ED Notes (Signed)
Surgery at bedside.

## 2020-03-26 NOTE — ED Notes (Signed)
X-ray at bedside

## 2020-03-26 NOTE — Consult Note (Signed)
[facility] SURGICAL CONSULTATION NOTE (initial) - cpt: 22025   HISTORY OF PRESENT ILLNESS (HPI):  34 y.o. female presented to [facility] ED yesterday for evaluation of abdominal pain. Patient initially presented to the ED on 08/22 for abdominal pain but LWOB. She represented to the ED on 08/23 via EMS this time for the same. She reports rather generalized abdominal pain and can not localize this. She described the pain as a severe ache in nature on presentation. She has been having associated nausea and multiple episodes of emesis. From review of the notes, it sounds like she had relief from the pain last night after vomiting in the lobby, but the pain returned again yesterday. No associated fever, chills, cough, congestion, CP, SOB, or urinary changes. She does have a history of exploratory laparotomy in 2008. Work up in the ED yesterday was concerning for leukocytosis to 14K (which is improved to 13.1K this morning), renal function normal with sCr - 0.61, hypokalemia to 3.1, and CT Abdomen/Pelvis was concerning for small bowel obstruction and transition point in the mid abdomen. She was admitted to the medicine service. NGT placement was attempted but was unsuccessful.   Surgery is consulted by emergency medicine provider Burman Blacksmith, PA-C in this context for evaluation and management of SBO.  This morning, patient is somnolent but arouses to participate in interview. She reports her abdomen is sore but overall improved from presentation. No longer with nausea or emesis. She reports she was passing flatus yesterday but not this morning.    PAST MEDICAL HISTORY (PMH):  Past Medical History:  Diagnosis Date  . Cocaine abuse (HCC)   . Psychosis (HCC)   . Suicide attempt (HCC)      PAST SURGICAL HISTORY (PSH):  Past Surgical History:  Procedure Laterality Date  . EXPLORATORY LAPAROTOMY  11/29/2006     MEDICATIONS:  Prior to Admission medications   Not on File     ALLERGIES:  No  Known Allergies   SOCIAL HISTORY:  Social History   Socioeconomic History  . Marital status: Single    Spouse name: Not on file  . Number of children: Not on file  . Years of education: Not on file  . Highest education level: Not on file  Occupational History  . Not on file  Tobacco Use  . Smoking status: Never Smoker  . Smokeless tobacco: Never Used  Substance and Sexual Activity  . Alcohol use: Yes    Comment: 1 beer every day  . Drug use: Yes    Types: Cocaine  . Sexual activity: Not on file  Other Topics Concern  . Not on file  Social History Narrative  . Not on file   Social Determinants of Health   Financial Resource Strain:   . Difficulty of Paying Living Expenses: Not on file  Food Insecurity:   . Worried About Programme researcher, broadcasting/film/video in the Last Year: Not on file  . Ran Out of Food in the Last Year: Not on file  Transportation Needs:   . Lack of Transportation (Medical): Not on file  . Lack of Transportation (Non-Medical): Not on file  Physical Activity:   . Days of Exercise per Week: Not on file  . Minutes of Exercise per Session: Not on file  Stress:   . Feeling of Stress : Not on file  Social Connections:   . Frequency of Communication with Friends and Family: Not on file  . Frequency of Social Gatherings with Friends and Family:  Not on file  . Attends Religious Services: Not on file  . Active Member of Clubs or Organizations: Not on file  . Attends Banker Meetings: Not on file  . Marital Status: Not on file  Intimate Partner Violence:   . Fear of Current or Ex-Partner: Not on file  . Emotionally Abused: Not on file  . Physically Abused: Not on file  . Sexually Abused: Not on file     FAMILY HISTORY:  History reviewed. No pertinent family history.    REVIEW OF SYSTEMS:  Review of Systems  Constitutional: Negative for chills and fever.  HENT: Negative for congestion and sore throat.   Respiratory: Negative for cough and shortness of  breath.   Cardiovascular: Negative for chest pain and palpitations.  Gastrointestinal: Positive for abdominal pain (Improved), nausea (resolved) and vomiting (Resolved). Negative for constipation and diarrhea.  All other systems reviewed and are negative.   VITAL SIGNS:  Temp:  [98.8 F (37.1 C)] 98.8 F (37.1 C) (08/23 1401) Pulse Rate:  [66-96] 80 (08/24 0630) Resp:  [14-20] 18 (08/24 0630) BP: (89-108)/(65-88) 94/72 (08/24 0630) SpO2:  [98 %-100 %] 98 % (08/24 0630) Weight:  [44 kg] 44 kg (08/23 1403)     Height: 5\' 4"  (162.6 cm) Weight: 44 kg BMI (Calculated): 16.64   INTAKE/OUTPUT:  No intake/output data recorded.  PHYSICAL EXAM:  Physical Exam Vitals and nursing note reviewed. Exam conducted with a chaperone present.  Constitutional:      General: She is not in acute distress.    Appearance: She is well-developed and normal weight. She is not ill-appearing.  HENT:     Head: Normocephalic and atraumatic.  Eyes:     General: No scleral icterus.    Extraocular Movements: Extraocular movements intact.  Cardiovascular:     Rate and Rhythm: Regular rhythm. Tachycardia present.     Heart sounds: Normal heart sounds. No murmur heard.   Pulmonary:     Effort: Pulmonary effort is normal. No respiratory distress.     Breath sounds: Normal breath sounds.  Abdominal:     General: A surgical scar is present. There is no distension.     Palpations: Abdomen is soft.     Tenderness: There is generalized abdominal tenderness (Mild, soreness).     Comments: Abdomen is soft, generalized soreness, non-distended, no round/guarding/peritonitis, midline laparotomy scar present  Genitourinary:    Comments: Deferred Skin:    General: Skin is warm and dry.     Coloration: Skin is not jaundiced or pale.  Neurological:     General: No focal deficit present.     Mental Status: She is alert and oriented to person, place, and time.  Psychiatric:        Mood and Affect: Mood normal.         Behavior: Behavior normal.      Labs:  CBC Latest Ref Rng & Units 03/26/2020 03/25/2020 03/24/2020  WBC 4.0 - 10.5 K/uL 13.1(H) 14.0(H) 16.0(H)  Hemoglobin 12.0 - 15.0 g/dL 10.7(L) 13.6 12.7  Hematocrit 36 - 46 % 31.3(L) 41.1 39.2  Platelets 150 - 400 K/uL 259 365 345   CMP Latest Ref Rng & Units 03/26/2020 03/25/2020 03/24/2020  Glucose 70 - 99 mg/dL 87 98 03/26/2020)  BUN 6 - 20 mg/dL 7 14 10   Creatinine 0.44 - 1.00 mg/dL 681(E 7.51  Sodium 135 - 145 mmol/L 134(L) 139 138  Potassium 3.5 - 5.1 mmol/L 3.6 3.1(L) 2.9(L)  Chloride 98 -  111 mmol/L 102 98 100  CO2 22 - 32 mmol/L 25 25 23   Calcium 8.9 - 10.3 mg/dL ) 9.7 9.4  Total Protein 6.5 - 8.1 g/dL - 8.7(H) 8.0  Total Bilirubin 0.3 - 1.2 mg/dL - 1.9(H) 1.3(H)  Alkaline Phos 38 - 126 U/L - 40 35(L)  AST 15 - 41 U/L - 16 16  ALT 0 - 44 U/L - 11 9     Imaging studies:   CT Abdomen/Pelvis (03/25/2020) personally reviewed showing dilated loops of small bowel with transition point in mid-abdomen, no pneumatosis or pneumoperitoneum, and radiologist report reviewed:  IMPRESSION: 1. Small bowel obstruction with transition point in the central abdomen. This may be due to adhesions, however there is mild swirling in the mesentery at the site of obstruction which raises the possibility of internal hernia. No evidence of bowel inflammation or ischemia. 2. Simple 3.2 cm left ovarian cyst. Cystic structures in the right adnexa may represent ovarian or para ovarian cysts, 1 measuring 2.4 cm, 1 measuring 3.8 cm. Small amount of simple free fluid in the pelvis. 3. Cholelithiasis without gallbladder inflammation. 4. Minimal patchy ground-glass airspace disease in the right middle lobe, suspicious for bronchiolitis or pneumonia.  KUB (03/26/2020) personally reviewed showing persistently dilated loops of bowel, no significant improvement, and radiologist report reviewed:  IMPRESSION: Stable compared to CT from yesterday. Persistent findings  of small bowel obstruction.   Assessment/Plan: (ICD-10's: K89.609) 34 y.o. 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   - Appreciate medicine admission  - recommend remain NPO today + IVF resuscitation  - I think we can hold off on NGT placement this morning given clinical improvement however she understands if her pain worsens or develops nausea and emesis then we will recommend placement of this  - No emergent surgical intervention  - Monitor abdominal examinations; on-going bowel function; serial KUBs as needed  - pain control prn (minimize narcotics); antiemetics prn    - mobilization encouraged  - further management per primary service; we will follow  All of the above findings and recommendations were discussed with the patient, and all of patient's questions were answered to her expressed satisfaction.  Thank you for the opportunity to participate in this patient's care.   -- 35, PA-C [facility] 03/26/2020, 7:21 AM [phone] M-F: 7am - 4pm

## 2020-03-26 NOTE — ED Notes (Signed)
Pt ambulatory to toilet

## 2020-03-26 NOTE — ED Notes (Signed)
Dr. Aleen Campi at bedside again. Pt requested to talk to him, had a few more questions for him.

## 2020-03-26 NOTE — ED Notes (Signed)
Dr. Danford at bedside  

## 2020-03-26 NOTE — ED Notes (Signed)
Attempt x3 for NG placement. Pt refuses anymore attempts. MD made aware.

## 2020-03-26 NOTE — Progress Notes (Signed)
PHARMACIST - PHYSICIAN COMMUNICATION  CONCERNING:  Enoxaparin (Lovenox) for DVT Prophylaxis    RECOMMENDATION: Patient was prescribed enoxaprin 40mg  q24 hours for VTE prophylaxis.   Filed Weights   03/25/20 1403  Weight: 44 kg (97 lb)    Body mass index is 16.65 kg/m.  Estimated Creatinine Clearance: 70.1 mL/min (by C-G formula based on SCr of 0.45 mg/dL).   Patient is candidate for enoxaparin 30mg  every 24 hours based on  Weight <45kg  DESCRIPTION: Pharmacy has adjusted enoxaparin dose per [facility] policy.   Patient is now receiving enoxaparin 30mg  every 24 hours.  03/27/20, PharmD, BCPS Clinical Pharmacist 03/26/2020 12:15 PM

## 2020-03-26 NOTE — ED Notes (Signed)
Pt ambulatory to restroom

## 2020-03-26 NOTE — Progress Notes (Signed)
[facility] PROGRESS NOTE    Erica Park  WUJ:[id number] DOB: 12/12/1985 DOA: 03/25/2020 PCP: Patient, No Pcp Per      Brief Narrative:  [name] is a 34 y.o. F with schizoaffective disorder, bipolar type who presented with abdominal pain and vomiting for 1 day.  In the ER, CT abdomen pelvis showed bowel obstruction.       Assessment & Plan:  Small bowel obstruction Patient was admitted, started on IV fluids.  Attempt was made to place NG tube unsuccessfully, however the patient has stopped vomiting, and her abdominal exam is benign.  She has a history of exploratory laparotomy in April 2008 at [facility].  Unclear the circumstances, but she was found to have a ovarian cyst which was removed and hemoperitoneum of unclear origin.  No prior SBO before the present admission.  She is clearly improving despite no NG or gastrograffin.  -NPO -Continue IVF -Trial clears tomorrow, maybe home tomorrow afternoon -Antiemetics and pain medicine as needed   Schizoaffective disorder Not on medication at present, no active symptoms of psychosis.  It appears she was last at [facility] in 2019 for suicidal and homicidal ideation.        Disposition: Status is: Inpatient  Remains inpatient appropriate because:IV treatments appropriate due to intensity of illness or inability to take PO   Dispo: The patient is from: Home              Anticipated d/c is to: Home              Anticipated d/c date is: 1 day              Patient currently is not medically stable to d/c.              MDM: The below labs and imaging reports were reviewed and summarized above.  Medication management as above.  DVT prophylaxis: enoxaparin (LOVENOX) injection 30 mg Start: 03/26/20 2200  Code Status: FUL Family Communication: Partner at the bedside    Consultants:   General surgery  Procedures:     Antimicrobials:      Culture data:               Subjective: Patient is feeling better.  She is had no vomiting since last night.  She has moderate abdominal pain, improved with medication.  No fever or confusion.  Objective: Vitals:   03/26/20 1100 03/26/20 1230 03/26/20 1300 03/26/20 1330  BP:  97/69 102/78 111/76  Pulse: 85 77 74 79  Resp:  16    Temp:      TempSrc:      SpO2: 100% 100% 100% 100%  Weight:      Height:        Intake/Output Summary (Last 24 hours) at 03/26/2020 1617 Last data filed at 03/26/2020 1126 Gross per 24 hour  Intake 115.67 ml  Output --  Net 115.67 ml   Filed Weights   03/25/20 1403  Weight: 44 kg    Examination: General appearance:  adult female, alert and in no acute distress.   HEENT: Anicteric, conjunctiva pink, lids and lashes normal. No nasal deformity, discharge, epistaxis.  Lips moist.   Skin: Warm and dry.  No jaundice.  No suspicious rashes or lesions. Cardiac: RRR, nl S1-S2, no murmurs appreciated.  Capillary refill is brisk.  JVP normal. no LE edema.  Radial  pulses 2+ and symmetric. Respiratory: Normal respiratory rate and rhythm.  CTAB without  rales or wheezes. Abdomen: Abdomen soft.  Mild nonfocal TTP with voluntary guarding. No ascites, distension, hepatosplenomegaly.   MSK: No deformities or effusions. Neuro: Awake and alert.  EOMI, moves all extremities. Speech fluent.    Psych: Sensorium intact and responding to questions, attention normal. Affect normal.  Judgment and insight appear normal.    Data Reviewed: I have personally reviewed following labs and imaging studies:  CBC: Recent Labs  Lab 03/24/20 2057 03/25/20 1406 03/26/20 0534  WBC 16.0* 14.0* 13.1*  HGB 12.7 13.6 10.7*  HCT 39.2 41.1 31.3*  MCV 94.5 92.8 91.0  PLT 345 365 259   Basic Metabolic Panel: Recent Labs  Lab 03/24/20 2057 03/25/20 1406 03/26/20 0534  NA 138 139 134*  K 2.9* 3.1* 3.6  CL 100 98 102  CO2 23 25 25   GLUCOSE 139* 98 87  BUN 10 14 7   CREATININE 0.62 0.61  0.45  CALCIUM 9.4 9.7 8.8*   GFR: Estimated Creatinine Clearance: 70.1 mL/min (by C-G formula based on SCr of 0.45 mg/dL). Liver Function Tests: Recent Labs  Lab 03/24/20 2057 03/25/20 1406  AST 16 16  ALT 9 11  ALKPHOS 35* 40  BILITOT 1.3* 1.9*  PROT 8.0 8.7*  ALBUMIN 4.8 5.1*   Recent Labs  Lab 03/24/20 2057 03/25/20 1406  LIPASE 22 21   No results for input(s): AMMONIA in the last 168 hours. Coagulation Profile: No results for input(s): INR, PROTIME in the last 168 hours. Cardiac Enzymes: No results for input(s): CKTOTAL, CKMB, CKMBINDEX, TROPONINI in the last 168 hours. BNP (last 3 results) No results for input(s): PROBNP in the last 8760 hours. HbA1C: No results for input(s): HGBA1C in the last 72 hours. CBG: No results for input(s): GLUCAP in the last 168 hours. Lipid Profile: No results for input(s): CHOL, HDL, LDLCALC, TRIG, CHOLHDL, LDLDIRECT in the last 72 hours. Thyroid Function Tests: No results for input(s): TSH, T4TOTAL, FREET4, T3FREE, THYROIDAB in the last 72 hours. Anemia Panel: No results for input(s): VITAMINB12, FOLATE, FERRITIN, TIBC, IRON, RETICCTPCT in the last 72 hours. Urine analysis:    Component Value Date/Time   COLORURINE AMBER (A) 03/25/2020 1800   APPEARANCEUR CLOUDY (A) 03/25/2020 1800   LABSPEC 1.035 (H) 03/25/2020 1800   PHURINE 5.0 03/25/2020 1800   GLUCOSEU 50 (A) 03/25/2020 1800   HGBUR NEGATIVE 03/25/2020 1800   BILIRUBINUR MODERATE (A) 03/25/2020 1800   KETONESUR 80 (A) 03/25/2020 1800   PROTEINUR 100 (A) 03/25/2020 1800   NITRITE NEGATIVE 03/25/2020 1800   LEUKOCYTESUR NEGATIVE 03/25/2020 1800   Sepsis Labs: @LABRCNTIP (procalcitonin:4,lacticacidven:4)  ) Recent Results (from the past 240 hour(s))  SARS Coronavirus 2 by RT PCR (hospital order, performed in [facility] hospital lab) Nasopharyngeal Nasopharyngeal Swab     Status: None   Collection Time: 03/25/20  6:16 PM   Specimen: Nasopharyngeal Swab  Result Value  Ref Range Status   SARS Coronavirus 2 NEGATIVE NEGATIVE Final    Comment: (NOTE) SARS-CoV-2 target nucleic acids are NOT DETECTED.  The SARS-CoV-2 RNA is generally detectable in upper and lower respiratory specimens during the acute phase of infection. The lowest concentration of SARS-CoV-2 viral copies this assay can detect is 250 copies / mL. A negative result does not preclude SARS-CoV-2 infection and should not be used as the sole basis for treatment or other patient management decisions.  A negative result may occur with improper specimen collection / handling, submission of specimen other than nasopharyngeal swab, presence of viral mutation(s) within the areas targeted  by this assay, and inadequate number of viral copies (<250 copies / mL). A negative result must be combined with clinical observations, patient history, and epidemiological information.  Fact Sheet for Patients:   BoilerBrush.com.cy  Fact Sheet for Healthcare Providers: https://pope.com/  This test is not yet approved or  cleared by the [place] FDA and has been authorized for detection and/or diagnosis of SARS-CoV-2 by FDA under an Emergency Use Authorization (EUA).  This EUA will remain in effect (meaning this test can be used) for the duration of the COVID-19 declaration under Section 564(b)(1) of the Act, 21 U.S.C. section 360bbb-3(b)(1), unless the authorization is terminated or revoked sooner.  Performed at [facility], [address], [place], [place] [address]          Radiology Studies: DG Abd 1 View  Result Date: 03/26/2020 CLINICAL DATA:  Diffuse abdominal pain with history of laparotomy. EXAM: ABDOMEN - 1 VIEW COMPARISON:  CT from yesterday FINDINGS: Dilated small bowel loops measuring up to 4 cm in the left abdomen. Today's appearance is similar to scanogram from comparison. No concerning mass effect or calcification. Lung bases  are clear. IMPRESSION: Stable compared to CT from yesterday. Persistent findings of small bowel obstruction. Electronically Signed   By: Marnee Spring M.D.   On: 03/26/2020 07:11   CT ABDOMEN PELVIS W CONTRAST  Result Date: 03/25/2020 CLINICAL DATA:  Generalized abdominal pain and fever. Nausea and vomiting. EXAM: CT ABDOMEN AND PELVIS WITH CONTRAST TECHNIQUE: Multidetector CT imaging of the abdomen and pelvis was performed using the standard protocol following bolus administration of intravenous contrast. CONTRAST:  16mL OMNIPAQUE IOHEXOL 300 MG/ML  SOLN COMPARISON:  None. FINDINGS: Lower chest: Minimal patchy ground-glass airspace disease in the right middle lobe. No pleural fluid. Hepatobiliary: Homogeneous liver attenuation without focal abnormality. Small gallstones. No pericholecystic inflammation. No biliary dilatation. Pancreas: No ductal dilatation or inflammation. Spleen: Normal in size without focal abnormality. Adrenals/Urinary Tract: Normal adrenal glands. No hydronephrosis or perinephric edema. Homogeneous renal enhancement. Urinary bladder is nondistended and not well evaluated. Stomach/Bowel: Bowel evaluation is limited in the absence of enteric contrast and paucity of intra-abdominal fat. Fluid distended stomach without wall thickening. Proximal small bowel are dilated and fluid-filled. There is transition point in the central abdomen, series 2, image 47, series 5, image 28. In this region there is mild mesenteric swirling. No small bowel wall thickening, inflammatory change, or pneumatosis. There is minor mesenteric edema. The more distal small bowel are decompressed. Normal appendix. Majority of the colon is decompressed. Difficult to exclude colonic wall thickening. Vascular/Lymphatic: Normal caliber abdominal aorta. Patent portal vein. No portal or mesenteric gas. No enlarged lymph nodes in the abdomen or pelvis. Reproductive: Probable nabothian cysts in the cervix. Uterus is anteverted.  There is a 3.2 cm simple cyst in the left ovary. To cystic structures in the right adnexa may represent ovarian or para ovarian cysts, 1 measuring 2.4 cm, 1 measuring 3.8 cm. There is a small amount of simple free fluid in the dependent pelvis. Other: Mild mesenteric edema. Small amount of free fluid in the dependent pelvis as well as adjacent to the bladder. No free air. Musculoskeletal: There are no acute or suspicious osseous abnormalities. IMPRESSION: 1. Small bowel obstruction with transition point in the central abdomen. This may be due to adhesions, however there is mild swirling in the mesentery at the site of obstruction which raises the possibility of internal hernia. No evidence of bowel inflammation or ischemia. 2. Simple 3.2 cm  left ovarian cyst. Cystic structures in the right adnexa may represent ovarian or para ovarian cysts, 1 measuring 2.4 cm, 1 measuring 3.8 cm. Small amount of simple free fluid in the pelvis. 3. Cholelithiasis without gallbladder inflammation. 4. Minimal patchy ground-glass airspace disease in the right middle lobe, suspicious for bronchiolitis or pneumonia. Electronically Signed   By: Narda RutherfordMelanie  Sanford M.D.   On: 03/25/2020 20:11        Scheduled Meds: . enoxaparin (LOVENOX) injection  30 mg Subcutaneous Q24H   Continuous Infusions: . lactated ringers 125 mL/hr at 03/26/20 1126  . piperacillin-tazobactam (ZOSYN)  IV 3.375 g (03/26/20 1455)     LOS: 1 day    Time spent: 25 minutes    Alberteen Samhristopher P Chalisa Kobler, MD [facility] 03/26/2020, 4:17 PM     Please page though AMION or Epic secure chat:  For Sears Holdings Corporationmion password, Higher education careers advisercontact charge nurse

## 2020-03-26 NOTE — ED Notes (Signed)
This nurse assumes care of pt, pt is asleep in bed, connected to monitor. Pt has call bell by left side on bed railing

## 2020-03-27 DIAGNOSIS — K56609 Unspecified intestinal obstruction, unspecified as to partial versus complete obstruction: Secondary | ICD-10-CM

## 2020-03-27 DIAGNOSIS — D72828 Other elevated white blood cell count: Secondary | ICD-10-CM

## 2020-03-27 DIAGNOSIS — F259 Schizoaffective disorder, unspecified: Secondary | ICD-10-CM

## 2020-03-27 NOTE — Progress Notes (Signed)
PROGRESS NOTE    Erica Park  JQB:[id number] DOB: 1985-10-24 DOA: 03/25/2020 PCP: Erica Park, No Pcp Per    Assessment & Plan:   Active Problems:   SBO (small bowel obstruction) (HCC)   Abnormal finding on lung imaging   Small bowel obstruction: continue on IVFs. Hx of exploratory laparotomy April 2008 at [facility] & found to ovarian cyst which was removed & hemoperitoneum of unclear etiology. Zofran prn. Advanced to clear liquid diet. Gas present today & one liquid stool  Schizoaffective disorder: no medication at present. No psychosis symptoms currently. Last at [facility] in 2019 for suicidal and homicidal ideation.  Leukocytosis: etiology unclear, possibly reactive. Will continue to monitor    DVT prophylaxis: lovenox Code Status:  Full  Family Communication:  Disposition Plan: likely d/c back home   Status is: Inpatient  Remains inpatient appropriate because:Ongoing diagnostic testing needed not appropriate for outpatient work up   Dispo: The Erica Park is from: Home              Anticipated d/c is to: Home              Anticipated d/c date is: 1 day              Erica Park currently is not medically stable to d/c.    Consultants:      Procedures:   Antimicrobials:    Subjective: Pt c/o fatigue   Objective: Vitals:   03/26/20 1824 03/26/20 2005 03/26/20 2043 03/27/20 0544  BP: (!) 125/102 104/73 97/64 (!) 89/55  Pulse: 71 77 62 64  Resp: 16 18 18 16   Temp:   98.7 F (37.1 C) 98.6 F (37 C)  TempSrc:   Oral Oral  SpO2: 100% 100% 100% 99%  Weight:      Height:        Intake/Output Summary (Last 24 hours) at 03/27/2020 0723 Last data filed at 03/27/2020 0552 Gross per 24 hour  Intake 1944.28 ml  Output 200 ml  Net 1744.28 ml   Filed Weights   03/25/20 1403  Weight: 44 kg    Examination:  General exam: Appears calm and comfortable  Respiratory system: Clear to auscultation. Respiratory effort normal. Cardiovascular system: S1 & S2 +. No  rubs, gallops or clicks. No pedal edema. Gastrointestinal system: Abdomen is nondistended, soft and nontender. Hypoactive bowel sounds heard. Central nervous system: Alert and oriented. No focal neurological deficits. Psychiatry: Judgement and insight appear normal. Flat mood and affect.     Data Reviewed: I have personally reviewed following labs and imaging studies  CBC: Recent Labs  Lab 03/24/20 2057 03/25/20 1406 03/26/20 0534  WBC 16.0* 14.0* 13.1*  HGB 12.7 13.6 10.7*  HCT 39.2 41.1 31.3*  MCV 94.5 92.8 91.0  PLT 345 365 259   Basic Metabolic Panel: Recent Labs  Lab 03/24/20 2057 03/25/20 1406 03/26/20 0534  NA 138 139 134*  K 2.9* 3.1* 3.6  CL 100 98 102  CO2 23 25 25   GLUCOSE 139* 98 87  BUN 10 14 7   CREATININE 0.62 0.61 0.45  CALCIUM 9.4 9.7 8.8*   GFR: Estimated Creatinine Clearance: 70.1 mL/min (by C-G formula based on SCr of 0.45 mg/dL). Liver Function Tests: Recent Labs  Lab 03/24/20 2057 03/25/20 1406  AST 16 16  ALT 9 11  ALKPHOS 35* 40  BILITOT 1.3* 1.9*  PROT 8.0 8.7*  ALBUMIN 4.8 5.1*   Recent Labs  Lab 03/24/20 2057 03/25/20 1406  LIPASE 22 21  No results for input(s): AMMONIA in the last 168 hours. Coagulation Profile: No results for input(s): INR, PROTIME in the last 168 hours. Cardiac Enzymes: No results for input(s): CKTOTAL, CKMB, CKMBINDEX, TROPONINI in the last 168 hours. BNP (last 3 results) No results for input(s): PROBNP in the last 8760 hours. HbA1C: No results for input(s): HGBA1C in the last 72 hours. CBG: No results for input(s): GLUCAP in the last 168 hours. Lipid Profile: No results for input(s): CHOL, HDL, LDLCALC, TRIG, CHOLHDL, LDLDIRECT in the last 72 hours. Thyroid Function Tests: No results for input(s): TSH, T4TOTAL, FREET4, T3FREE, THYROIDAB in the last 72 hours. Anemia Panel: No results for input(s): VITAMINB12, FOLATE, FERRITIN, TIBC, IRON, RETICCTPCT in the last 72 hours. Sepsis Labs: No results  for input(s): PROCALCITON, LATICACIDVEN in the last 168 hours.  Recent Results (from the past 240 hour(s))  SARS Coronavirus 2 by RT PCR (hospital order, performed in [facility] hospital lab) Nasopharyngeal Nasopharyngeal Swab     Status: None   Collection Time: 03/25/20  6:16 PM   Specimen: Nasopharyngeal Swab  Result Value Ref Range Status   SARS Coronavirus 2 NEGATIVE NEGATIVE Final    Comment: (NOTE) SARS-CoV-2 target nucleic acids are NOT DETECTED.  The SARS-CoV-2 RNA is generally detectable in upper and lower respiratory specimens during the acute phase of infection. The lowest concentration of SARS-CoV-2 viral copies this assay can detect is 250 copies / mL. A negative result does not preclude SARS-CoV-2 infection and should not be used as the sole basis for treatment or other Erica Park management decisions.  A negative result may occur with improper specimen collection / handling, submission of specimen other than nasopharyngeal swab, presence of viral mutation(s) within the areas targeted by this assay, and inadequate number of viral copies (<250 copies / mL). A negative result must be combined with clinical observations, Erica Park history, and epidemiological information.  Fact Sheet for Patients:   BoilerBrush.com.cy  Fact Sheet for Healthcare Providers: https://pope.com/  This test is not yet approved or  cleared by the [place] FDA and has been authorized for detection and/or diagnosis of SARS-CoV-2 by FDA under an Emergency Use Authorization (EUA).  This EUA will remain in effect (meaning this test can be used) for the duration of the COVID-19 declaration under Section 564(b)(1) of the Act, 21 U.S.C. section 360bbb-3(b)(1), unless the authorization is terminated or revoked sooner.  Performed at [facility], [address], [place], [place] [address]          Radiology Studies: DG Abd 1  View  Result Date: 03/26/2020 CLINICAL DATA:  Diffuse abdominal pain with history of laparotomy. EXAM: ABDOMEN - 1 VIEW COMPARISON:  CT from yesterday FINDINGS: Dilated small bowel loops measuring up to 4 cm in the left abdomen. Today's appearance is similar to scanogram from comparison. No concerning mass effect or calcification. Lung bases are clear. IMPRESSION: Stable compared to CT from yesterday. Persistent findings of small bowel obstruction. Electronically Signed   By: Marnee Spring M.D.   On: 03/26/2020 07:11   CT ABDOMEN PELVIS W CONTRAST  Result Date: 03/25/2020 CLINICAL DATA:  Generalized abdominal pain and fever. Nausea and vomiting. EXAM: CT ABDOMEN AND PELVIS WITH CONTRAST TECHNIQUE: Multidetector CT imaging of the abdomen and pelvis was performed using the standard protocol following bolus administration of intravenous contrast. CONTRAST:  17mL OMNIPAQUE IOHEXOL 300 MG/ML  SOLN COMPARISON:  None. FINDINGS: Lower chest: Minimal patchy ground-glass airspace disease in the right middle lobe. No pleural fluid. Hepatobiliary:  Homogeneous liver attenuation without focal abnormality. Small gallstones. No pericholecystic inflammation. No biliary dilatation. Pancreas: No ductal dilatation or inflammation. Spleen: Normal in size without focal abnormality. Adrenals/Urinary Tract: Normal adrenal glands. No hydronephrosis or perinephric edema. Homogeneous renal enhancement. Urinary bladder is nondistended and not well evaluated. Stomach/Bowel: Bowel evaluation is limited in the absence of enteric contrast and paucity of intra-abdominal fat. Fluid distended stomach without wall thickening. Proximal small bowel are dilated and fluid-filled. There is transition point in the central abdomen, series 2, image 47, series 5, image 28. In this region there is mild mesenteric swirling. No small bowel wall thickening, inflammatory change, or pneumatosis. There is minor mesenteric edema. The more distal small bowel  are decompressed. Normal appendix. Majority of the colon is decompressed. Difficult to exclude colonic wall thickening. Vascular/Lymphatic: Normal caliber abdominal aorta. Patent portal vein. No portal or mesenteric gas. No enlarged lymph nodes in the abdomen or pelvis. Reproductive: Probable nabothian cysts in the cervix. Uterus is anteverted. There is a 3.2 cm simple cyst in the left ovary. To cystic structures in the right adnexa may represent ovarian or para ovarian cysts, 1 measuring 2.4 cm, 1 measuring 3.8 cm. There is a small amount of simple free fluid in the dependent pelvis. Other: Mild mesenteric edema. Small amount of free fluid in the dependent pelvis as well as adjacent to the bladder. No free air. Musculoskeletal: There are no acute or suspicious osseous abnormalities. IMPRESSION: 1. Small bowel obstruction with transition point in the central abdomen. This may be due to adhesions, however there is mild swirling in the mesentery at the site of obstruction which raises the possibility of internal hernia. No evidence of bowel inflammation or ischemia. 2. Simple 3.2 cm left ovarian cyst. Cystic structures in the right adnexa may represent ovarian or para ovarian cysts, 1 measuring 2.4 cm, 1 measuring 3.8 cm. Small amount of simple free fluid in the pelvis. 3. Cholelithiasis without gallbladder inflammation. 4. Minimal patchy ground-glass airspace disease in the right middle lobe, suspicious for bronchiolitis or pneumonia. Electronically Signed   By: Narda Rutherford M.D.   On: 03/25/2020 20:11        Scheduled Meds: . enoxaparin (LOVENOX) injection  30 mg Subcutaneous Q24H   Continuous Infusions: . lactated ringers 125 mL/hr at 03/26/20 2027     LOS: 2 days    Time spent: 33 mins     Charise Killian, MD [facility] Pager [phone]  If 7PM-7AM, please contact night-coverage [email or web address] 03/27/2020, 7:23 AM

## 2020-03-27 NOTE — Progress Notes (Signed)
[facility] SURGICAL PROGRESS NOTE (cpt [phone])  Hospital Day(s): 2.   Interval History: Patient seen and examined, no acute events or new complaints overnight. Patient reports she is feeling better this morning and is hungry, denies abdominal pain, distension, nausea, or emesis. No new labs or imaging in last 24 hours. She has remained NPO and did not require NGT placement. She reports that she started passing flatus last night, no BM. No issues with ambulation.   Review of Systems:  Constitutional: denies fever, chills  HEENT: denies cough or congestion  Respiratory: denies any shortness of breath  Cardiovascular: denies chest pain or palpitations  Gastrointestinal: denies abdominal pain, N/V, or diarrhea/and bowel function as per interval history Genitourinary: denies burning with urination or urinary frequency Musculoskeletal: denies pain, decreased motor or sensation  Vital signs in last 24 hours: [min-max] current  Temp:  [98.6 F (37 C)-98.7 F (37.1 C)] 98.6 F (37 C) (08/25 0544) Pulse Rate:  [62-88] 64 (08/25 0544) Resp:  [16-18] 16 (08/25 0544) BP: (89-125)/(55-102) 89/55 (08/25 0544) SpO2:  [94 %-100 %] 99 % (08/25 0544)     Height: 5\' 4"  (162.6 cm) Weight: 44 kg BMI (Calculated): 16.64   Intake/Output last 2 shifts:  08/24 0701 - 08/25 0700 In: 1944.3 [I.V.:1813.4; IV Piggyback:130.9] Out: 200 [Urine:200]   Physical Exam:  Constitutional: alert, cooperative and no distress  HENT: normocephalic without obvious abnormality  Eyes: PERRL, EOM's grossly intact and symmetric  Respiratory: breathing non-labored at rest  Cardiovascular: regular rate and sinus rhythm  Gastrointestinal: Soft, non-tender, and non-distended, no rebound/gaurding, previous midline laparotomy incision present Musculoskeletal: UE and LE FROM, no edema or wounds, motor and sensation grossly intact, NT    Labs:  CBC Latest Ref Rng & Units 03/26/2020 03/25/2020 03/24/2020  WBC 4.0 -  10.5 K/uL 13.1(H) 14.0(H) 16.0(H)  Hemoglobin 12.0 - 15.0 g/dL 10.7(L) 13.6 12.7  Hematocrit 36 - 46 % 31.3(L) 41.1 39.2  Platelets 150 - 400 K/uL 259 365 345   CMP Latest Ref Rng & Units 03/26/2020 03/25/2020 03/24/2020  Glucose 70 - 99 mg/dL 87 98 03/26/2020)  BUN 6 - 20 mg/dL 7 14 10   Creatinine 0.44 - 1.00 mg/dL 557(D 2.20  Sodium 135 - 145 mmol/L 134(L) 139 138  Potassium 3.5 - 5.1 mmol/L 3.6 3.1(L) 2.9(L)  Chloride 98 - 111 mmol/L 102 98 100  CO2 22 - 32 mmol/L 25 25 23   Calcium 8.9 - 10.3 mg/dL 2.54) 9.7 9.4  Total Protein 6.5 - 8.1 g/dL - 8.7(H) 8.0  Total Bilirubin 0.3 - 1.2 mg/dL - 1.9(H) 1.3(H)  Alkaline Phos 38 - 126 U/L - 40 35(L)  AST 15 - 41 U/L - 16 16  ALT 0 - 44 U/L - 11 9     Imaging studies: No new pertinent imaging studies   Assessment/Plan: (ICD-10's: K54.609) 34 y.o. 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    - Wean IVF as diet advances    - No emergent surgical intervention             - Monitor abdominal examinations; on-going bowel function; serial KUBs as needed             - pain control prn (minimize narcotics); antiemetics prn               - mobilization encouraged             -  further management per primary service; we will follow  All of the above findings and recommendations were discussed with the patient, and all of patient's questions were answered to her expressed satisfaction.  -- Lynden Oxford, PA-C [facility] 03/27/2020, 7:33 AM [phone] M-F: 7am - 4pm

## 2020-03-28 LAB — BASIC METABOLIC PANEL
Anion gap: 8 (ref 5–15)
BUN: <5 mg/dL — ABNORMAL LOW (ref 6–20)
CO2: 28 mmol/L (ref 22–32)
Calcium: 8.8 mg/dL — ABNORMAL LOW (ref 8.9–10.3)
Chloride: 103 mmol/L (ref 98–111)
Creatinine, Ser: 0.62 mg/dL (ref 0.44–1.00)
GFR calc Af Amer: >60 mL/min (ref >60)
GFR calc non Af Amer: >60 mL/min (ref >60)
Glucose, Bld: 100 mg/dL — ABNORMAL HIGH (ref 70–99)
Potassium: 3.2 mmol/L — ABNORMAL LOW (ref 3.5–5.1)
Sodium: 139 mmol/L (ref 135–145)

## 2020-03-28 LAB — CBC
HCT: 33.1 % — ABNORMAL LOW (ref 36.0–46.0)
Hemoglobin: 11 g/dL — ABNORMAL LOW (ref 12.0–15.0)
MCH: 31.1 pg (ref 26.0–34.0)
MCHC: 33.2 g/dL (ref 30.0–36.0)
MCV: 93.5 fL (ref 80.0–100.0)
Platelets: 241 10*3/uL (ref 150–400)
RBC: 3.54 MIL/uL — ABNORMAL LOW (ref 3.87–5.11)
RDW: 11.9 % (ref 11.5–15.5)
WBC: 5.3 10*3/uL (ref 4.0–10.5)
nRBC: 0 % (ref 0.0–0.2)

## 2020-03-28 NOTE — Discharge Summary (Signed)
Physician Discharge Summary  Erica Park RDE:[id number] DOB: November 17, 1985 DOA: 03/25/2020  PCP: Patient, No Pcp Per  Admit date: 03/25/2020 Discharge date: 03/28/2020  Admitted From: home Disposition: home  Recommendations for Outpatient Follow-up:  1. Follow up with PCP in 1-2 weeks   Home Health:no Equipment/Devices:  Discharge Condition: stable CODE STATUS: full  Diet recommendation:  Regular   Brief/Interim Summary: HPI was taken from Dr. Para March: Erica Park Erica Park is a 34 y.o. female with medical history significant for laparotomy who presents with diffuse abdominal pain associated with vomiting and diarrhea.  Presented to the emergency room yesterday but left without being seen due to prolonged wait times.  She denies dysuria or vaginal discomfort.  Denies fever and chills. ED Course: On arrival in the emergency room she was afebrile, borderline tachycardia of 91, BP 108/88 with otherwise normal vitals.  However what is what you choose selecting from the cafeteria.  WBC 14,000 with potassium 3.1 but otherwise unremarkable blood work.  Lipase normal, urinalysis with 80 ketones but no pyuria.  CT abdomen showed small bowel obstruction with transition point in the central abdomen.  The emergency room provider spoke with surgeon, Dr. Aleen Campi who requested hospitalist admission.  NG tube placed.  Hospitalist consulted for admission.  Of note there was an incidental finding of minimal patchy groundglass airspace disease in the right middle lobe suspicious for bronchiolitis or pneumonia.  Covid test however negative.  Hospital Course from Dr. Wilfred Lacy 8/25-8/26/21: Pt presented abd pain and found to have SBO. Pt was treated conservatively w/ bowel rest & IVFs. The SBO resolved prior to d/c. Pt was passing gas and having bowel movements prior to d/c. Pt did not need any surgical outpatient f/u as per gen surg. For more information please see previous progress notes.    Discharge Diagnoses:  Active Problems:   SBO (small bowel obstruction) (HCC)   Abnormal finding on lung imaging  Small bowel obstruction: w/ hx of exploratory laparotomy April 2008 at [facility] & found to ovarian cyst which was removed & hemoperitoneum of unclear etiology. Zofran prn. Tolerating po diet. Passing gas and had another liquid bowel movements. Resolved  Schizoaffective disorder: no medication at present. No psychosis symptoms currently. Last at [facility] in 2019 for suicidal and homicidal ideation.  Leukocytosis: etiology unclear, possibly reactive. Will continue to monitor  Discharge Instructions  Discharge Instructions    Diet general   Complete by: As directed    Discharge instructions   Complete by: As directed    F/u PCP in 1-2 weeks   Increase activity slowly   Complete by: As directed      Allergies as of 03/28/2020   No Known Allergies     Medication List    You have not been prescribed any medications.     No Known Allergies  Consultations:  gen surg, Dr. Aleen Campi    Procedures/Studies: DG Abd 1 View  Result Date: 03/26/2020 CLINICAL DATA:  Diffuse abdominal pain with history of laparotomy. EXAM: ABDOMEN - 1 VIEW COMPARISON:  CT from yesterday FINDINGS: Dilated small bowel loops measuring up to 4 cm in the left abdomen. Today's appearance is similar to scanogram from comparison. No concerning mass effect or calcification. Lung bases are clear. IMPRESSION: Stable compared to CT from yesterday. Persistent findings of small bowel obstruction. Electronically Signed   By: Marnee Spring M.D.   On: 03/26/2020 07:11   CT ABDOMEN PELVIS W CONTRAST  Result Date: 03/25/2020 CLINICAL DATA:  Generalized  abdominal pain and fever. Nausea and vomiting. EXAM: CT ABDOMEN AND PELVIS WITH CONTRAST TECHNIQUE: Multidetector CT imaging of the abdomen and pelvis was performed using the standard protocol following bolus administration of intravenous contrast. CONTRAST:  20mL  OMNIPAQUE IOHEXOL 300 MG/ML  SOLN COMPARISON:  None. FINDINGS: Lower chest: Minimal patchy ground-glass airspace disease in the right middle lobe. No pleural fluid. Hepatobiliary: Homogeneous liver attenuation without focal abnormality. Small gallstones. No pericholecystic inflammation. No biliary dilatation. Pancreas: No ductal dilatation or inflammation. Spleen: Normal in size without focal abnormality. Adrenals/Urinary Tract: Normal adrenal glands. No hydronephrosis or perinephric edema. Homogeneous renal enhancement. Urinary bladder is nondistended and not well evaluated. Stomach/Bowel: Bowel evaluation is limited in the absence of enteric contrast and paucity of intra-abdominal fat. Fluid distended stomach without wall thickening. Proximal small bowel are dilated and fluid-filled. There is transition point in the central abdomen, series 2, image 47, series 5, image 28. In this region there is mild mesenteric swirling. No small bowel wall thickening, inflammatory change, or pneumatosis. There is minor mesenteric edema. The more distal small bowel are decompressed. Normal appendix. Majority of the colon is decompressed. Difficult to exclude colonic wall thickening. Vascular/Lymphatic: Normal caliber abdominal aorta. Patent portal vein. No portal or mesenteric gas. No enlarged lymph nodes in the abdomen or pelvis. Reproductive: Probable nabothian cysts in the cervix. Uterus is anteverted. There is a 3.2 cm simple cyst in the left ovary. To cystic structures in the right adnexa may represent ovarian or para ovarian cysts, 1 measuring 2.4 cm, 1 measuring 3.8 cm. There is a small amount of simple free fluid in the dependent pelvis. Other: Mild mesenteric edema. Small amount of free fluid in the dependent pelvis as well as adjacent to the bladder. No free air. Musculoskeletal: There are no acute or suspicious osseous abnormalities. IMPRESSION: 1. Small bowel obstruction with transition point in the central abdomen.  This may be due to adhesions, however there is mild swirling in the mesentery at the site of obstruction which raises the possibility of internal hernia. No evidence of bowel inflammation or ischemia. 2. Simple 3.2 cm left ovarian cyst. Cystic structures in the right adnexa may represent ovarian or para ovarian cysts, 1 measuring 2.4 cm, 1 measuring 3.8 cm. Small amount of simple free fluid in the pelvis. 3. Cholelithiasis without gallbladder inflammation. 4. Minimal patchy ground-glass airspace disease in the right middle lobe, suspicious for bronchiolitis or pneumonia. Electronically Signed   By: Narda Rutherford M.D.   On: 03/25/2020 20:11      Subjective: Pt denies any abd pain but c/o malaise    Discharge Exam: Vitals:   03/27/20 1952 03/28/20 0353  BP: 91/68 92/66  Pulse: 69 62  Resp: 16 16  Temp: 98.7 F (37.1 C) 98.5 F (36.9 C)  SpO2: 100% 100%   Vitals:   03/27/20 0544 03/27/20 1231 03/27/20 1952 03/28/20 0353  BP: (!) 89/55 93/66 91/68  92/66  Pulse: 64 62 69 62  Resp: 16 16 16 16   Temp: 98.6 F (37 C) 98.3 F (36.8 C) 98.7 F (37.1 C) 98.5 F (36.9 C)  TempSrc: Oral Oral Oral Axillary  SpO2: 99% 100% 100% 100%  Weight:      Height:        General: Pt is alert, awake, not in acute distress Cardiovascular: S1/S2 +, no rubs, no gallops Respiratory: CTA bilaterally, no wheezing, no rhonchi Abdominal: Soft, NT, ND, hypoactive bowel sounds Extremities: no edema, no cyanosis    The results of significant  diagnostics from this hospitalization (including imaging, microbiology, ancillary and laboratory) are listed below for reference.     Microbiology: Recent Results (from the past 240 hour(s))  SARS Coronavirus 2 by RT PCR (hospital order, performed in [facility] hospital lab) Nasopharyngeal Nasopharyngeal Swab     Status: None   Collection Time: 03/25/20  6:16 PM   Specimen: Nasopharyngeal Swab  Result Value Ref Range Status   SARS Coronavirus 2 NEGATIVE  NEGATIVE Final    Comment: (NOTE) SARS-CoV-2 target nucleic acids are NOT DETECTED.  The SARS-CoV-2 RNA is generally detectable in upper and lower respiratory specimens during the acute phase of infection. The lowest concentration of SARS-CoV-2 viral copies this assay can detect is 250 copies / mL. A negative result does not preclude SARS-CoV-2 infection and should not be used as the sole basis for treatment or other patient management decisions.  A negative result may occur with improper specimen collection / handling, submission of specimen other than nasopharyngeal swab, presence of viral mutation(s) within the areas targeted by this assay, and inadequate number of viral copies (<250 copies / mL). A negative result must be combined with clinical observations, patient history, and epidemiological information.  Fact Sheet for Patients:   BoilerBrush.com.cy  Fact Sheet for Healthcare Providers: https://pope.com/  This test is not yet approved or  cleared by the [place] FDA and has been authorized for detection and/or diagnosis of SARS-CoV-2 by FDA under an Emergency Use Authorization (EUA).  This EUA will remain in effect (meaning this test can be used) for the duration of the COVID-19 declaration under Section 564(b)(1) of the Act, 21 U.S.C. section 360bbb-3(b)(1), unless the authorization is terminated or revoked sooner.  Performed at [facility], [address] [place], [place] [address]      Labs: BNP (last 3 results) No results for input(s): BNP in the last 8760 hours. Basic Metabolic Panel: Recent Labs  Lab 03/24/20 2057 03/25/20 1406 03/26/20 0534 03/28/20 0942  NA 138 139 134* 139  K 2.9* 3.1* 3.6 3.2*  CL 100 98 102 103  CO2 23 25 25 28   GLUCOSE 139* 98 87 100*  BUN 10 14 7  <5*  CREATININE 0.62 0.61 0.45 0.62  CALCIUM 9.4 9.7 8.8* 8.8*   Liver Function Tests: Recent Labs  Lab  03/24/20 2057 03/25/20 1406  AST 16 16  ALT 9 11  ALKPHOS 35* 40  BILITOT 1.3* 1.9*  PROT 8.0 8.7*  ALBUMIN 4.8 5.1*   Recent Labs  Lab 03/24/20 2057 03/25/20 1406  LIPASE 22 21   No results for input(s): AMMONIA in the last 168 hours. CBC: Recent Labs  Lab 03/24/20 2057 03/25/20 1406 03/26/20 0534 03/28/20 0942  WBC 16.0* 14.0* 13.1* 5.3  HGB 12.7 13.6 10.7* 11.0*  HCT 39.2 41.1 31.3* 33.1*  MCV 94.5 92.8 91.0 93.5  PLT 345 365 259 241   Cardiac Enzymes: No results for input(s): CKTOTAL, CKMB, CKMBINDEX, TROPONINI in the last 168 hours. BNP: Invalid input(s): POCBNP CBG: No results for input(s): GLUCAP in the last 168 hours. D-Dimer No results for input(s): DDIMER in the last 72 hours. Hgb A1c No results for input(s): HGBA1C in the last 72 hours. Lipid Profile No results for input(s): CHOL, HDL, LDLCALC, TRIG, CHOLHDL, LDLDIRECT in the last 72 hours. Thyroid function studies No results for input(s): TSH, T4TOTAL, T3FREE, THYROIDAB in the last 72 hours.  Invalid input(s): FREET3 Anemia work up No results for input(s): VITAMINB12, FOLATE, FERRITIN, TIBC, IRON, RETICCTPCT in the  last 72 hours. Urinalysis    Component Value Date/Time   COLORURINE AMBER (A) 03/25/2020 1800   APPEARANCEUR CLOUDY (A) 03/25/2020 1800   LABSPEC 1.035 (H) 03/25/2020 1800   PHURINE 5.0 03/25/2020 1800   GLUCOSEU 50 (A) 03/25/2020 1800   HGBUR NEGATIVE 03/25/2020 1800   BILIRUBINUR MODERATE (A) 03/25/2020 1800   KETONESUR 80 (A) 03/25/2020 1800   PROTEINUR 100 (A) 03/25/2020 1800   NITRITE NEGATIVE 03/25/2020 1800   LEUKOCYTESUR NEGATIVE 03/25/2020 1800   Sepsis Labs Invalid input(s): PROCALCITONIN,  WBC,  LACTICIDVEN Microbiology Recent Results (from the past 240 hour(s))  SARS Coronavirus 2 by RT PCR (hospital order, performed in [facility] lab) Nasopharyngeal Nasopharyngeal Swab     Status: None   Collection Time: 03/25/20  6:16 PM   Specimen: Nasopharyngeal Swab   Result Value Ref Range Status   SARS Coronavirus 2 NEGATIVE NEGATIVE Final    Comment: (NOTE) SARS-CoV-2 target nucleic acids are NOT DETECTED.  The SARS-CoV-2 RNA is generally detectable in upper and lower respiratory specimens during the acute phase of infection. The lowest concentration of SARS-CoV-2 viral copies this assay can detect is 250 copies / mL. A negative result does not preclude SARS-CoV-2 infection and should not be used as the sole basis for treatment or other patient management decisions.  A negative result may occur with improper specimen collection / handling, submission of specimen other than nasopharyngeal swab, presence of viral mutation(s) within the areas targeted by this assay, and inadequate number of viral copies (<250 copies / mL). A negative result must be combined with clinical observations, patient history, and epidemiological information.  Fact Sheet for Patients:   BoilerBrush.com.cyhttps://www.fda.gov/media/136312/download  Fact Sheet for Healthcare Providers: https://pope.com/https://www.fda.gov/media/136313/download  This test is not yet approved or  cleared by the [place] FDA and has been authorized for detection and/or diagnosis of SARS-CoV-2 by FDA under an Emergency Use Authorization (EUA).  This EUA will remain in effect (meaning this test can be used) for the duration of the COVID-19 declaration under Section 564(b)(1) of the Act, 21 U.S.C. section 360bbb-3(b)(1), unless the authorization is terminated or revoked sooner.  Performed at [facility], [address] [place], [place] [address]      Time coordinating discharge: Over 30 minutes  SIGNED:   Charise KillianJamiese M Axavier Pressley, MD  [facility] 03/28/2020, 11:26 AM Pager   If 7PM-7AM, please contact night-coverage [email or web address]

## 2020-03-28 NOTE — Plan of Care (Signed)
  Problem: Safety: Goal: Ability to remain free from injury will improve Outcome: Adequate for Discharge   

## 2020-03-28 NOTE — Treatment Plan (Signed)
Patient refused labs this morning, MD Mayford Knife made aware, pt states she wanted to speak to MD. MD stated he will speak to patient when he rounds this morning.

## 2020-03-28 NOTE — Progress Notes (Signed)
   03/28/20 1131  AVS Discharge Documentation  AVS Discharge Instructions Including Medications Provided to patient/caregiver  Name of Person Receiving AVS Discharge Instructions Including Medications Erica Park  Name of Clinician That Reviewed AVS Discharge Instructions Including Medications Barb Merino, RN  Pt given AVS at this time, denies questions. IV removed.

## 2020-03-28 NOTE — Progress Notes (Signed)
[facility] SURGICAL PROGRESS NOTE (cpt [phone])  Hospital Day(s): 3.   Interval History: Patient seen and examined, no acute events or new complaints overnight. Patient is frustrated this morning and wants to have a regular diet and go home. She denies fever, chills, nausea, emesis, abdominal pain. She continues to pass flatus and had 2 BMs in last 24 hours. She is refusing lab draw this morning. Mobilizing well.   Review of Systems:  Constitutional: denies fever, chills  HEENT: denies cough or congestion  Respiratory: denies any shortness of breath  Cardiovascular: denies chest pain or palpitations  Gastrointestinal: denies abdominal pain, N/V, or diarrhea/and bowel function as per interval history Genitourinary: denies burning with urination or urinary frequency Musculoskeletal: denies pain, decreased motor or sensation  Vital signs in last 24 hours: [min-max] current  Temp:  [98.3 F (36.8 C)-98.7 F (37.1 C)] 98.5 F (36.9 C) (08/26 0353) Pulse Rate:  [62-69] 62 (08/26 0353) Resp:  [16] 16 (08/26 0353) BP: (91-93)/(66-68) 92/66 (08/26 0353) SpO2:  [100 %] 100 % (08/26 0353)     Height: 5\' 4"  (162.6 cm) Weight: 44 kg BMI (Calculated): 16.64   Intake/Output last 2 shifts:  08/25 0701 - 08/26 0700 In: 2009.3 [P.O.:400; I.V.:1609.3] Out: -    Physical Exam:  Constitutional: alert, cooperative and no distress  HENT: normocephalic without obvious abnormality  Eyes: PERRL, EOM's grossly intact and symmetric  Respiratory: breathing non-labored at rest  Cardiovascular: regular rate and sinus rhythm  Gastrointestinal: Soft, non-tender, and non-distended, no rebound/guaridng. Midline laparotomy scar Musculoskeletal: UE and LE FROM, no edema or wounds, motor and sensation grossly intact, NT    Labs:  CBC Latest Ref Rng & Units 03/26/2020 03/25/2020 03/24/2020  WBC 4.0 - 10.5 K/uL 13.1(H) 14.0(H) 16.0(H)  Hemoglobin 12.0 - 15.0 g/dL 10.7(L) 13.6 12.7  Hematocrit 36 -  46 % 31.3(L) 41.1 39.2  Platelets 150 - 400 K/uL 259 365 345   CMP Latest Ref Rng & Units 03/26/2020 03/25/2020 03/24/2020  Glucose 70 - 99 mg/dL 87 98 03/26/2020)  BUN 6 - 20 mg/dL 7 14 10   Creatinine 0.44 - 1.00 mg/dL 382(N 0.53  Sodium 135 - 145 mmol/L 134(L) 139 138  Potassium 3.5 - 5.1 mmol/L 3.6 3.1(L) 2.9(L)  Chloride 98 - 111 mmol/L 102 98 100  CO2 22 - 32 mmol/L 25 25 23   Calcium 8.9 - 10.3 mg/dL 9.76) 9.7 9.4  Total Protein 6.5 - 8.1 g/dL - 8.7(H) 8.0  Total Bilirubin 0.3 - 1.2 mg/dL - 1.9(H) 1.3(H)  Alkaline Phos 38 - 126 U/L - 40 35(L)  AST 15 - 41 U/L - 16 16  ALT 0 - 44 U/L - 11 9     Imaging studies: No new pertinent imaging studies   Assessment/Plan: (ICD-10's: K27.609) 34 y.o. 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   - Advance to regular diet   - emergent surgical intervention - Monitor abdominal examinations; on-going bowel function   - pain control prn (minimize narcotics); antiemetics prn  - mobilization encouraged - further management per primary service    - Discharge Planning: If tolerates diet this morning, then she is okay for discharge from surgical standpoint. She will NOT need general surgery follow up and can follow up with PCP as needed   All of the above findings and recommendations were discussed with the patient, and the medical team, and all of patient's questions were answered to her expressed satisfaction.  --  Lynden Oxford, PA-C [facility] 03/28/2020, 8:30 AM [phone] M-F: 7am - 4pm

## 2020-04-07 ENCOUNTER — Other Ambulatory Visit: Payer: Self-pay

## 2020-04-07 DIAGNOSIS — K8020 Calculus of gallbladder without cholecystitis without obstruction: Secondary | ICD-10-CM | POA: Insufficient documentation

## 2020-04-07 DIAGNOSIS — R1010 Upper abdominal pain, unspecified: Secondary | ICD-10-CM | POA: Insufficient documentation

## 2020-04-07 LAB — CBC
HCT: 36.7 % (ref 36.0–46.0)
Hemoglobin: 12.3 g/dL (ref 12.0–15.0)
MCH: 30.5 pg (ref 26.0–34.0)
MCHC: 33.5 g/dL (ref 30.0–36.0)
MCV: 91.1 fL (ref 80.0–100.0)
Platelets: 429 10*3/uL — ABNORMAL HIGH (ref 150–400)
RBC: 4.03 MIL/uL (ref 3.87–5.11)
RDW: 12.5 % (ref 11.5–15.5)
WBC: 9.4 10*3/uL (ref 4.0–10.5)
nRBC: 0 % (ref 0.0–0.2)

## 2020-04-07 LAB — POCT PREGNANCY, URINE: Preg Test, Ur: NEGATIVE

## 2020-04-07 NOTE — ED Triage Notes (Signed)
Patient reports having abdominal pain that started today, vomited x 1 prior to EMS arrival at her house.

## 2020-04-07 NOTE — ED Notes (Signed)
Patient to waiting room via wheelchair by EMS.  Per EMS patient complains of abdominal pain for couple hours.  Seen in ED for similar symptoms last week.  BP 102/75.

## 2020-04-08 ENCOUNTER — Emergency Department
Admission: EM | Admit: 2020-04-08 | Discharge: 2020-04-08 | Disposition: A | Discharge status: Home / Self Care | Payer: Self-pay | Attending: Emergency Medicine | Admitting: Emergency Medicine

## 2020-04-08 ENCOUNTER — Emergency Department: Payer: Self-pay

## 2020-04-08 DIAGNOSIS — R1010 Upper abdominal pain, unspecified: Secondary | ICD-10-CM

## 2020-04-08 DIAGNOSIS — K8020 Calculus of gallbladder without cholecystitis without obstruction: Secondary | ICD-10-CM

## 2020-04-08 LAB — COMPREHENSIVE METABOLIC PANEL
ALT: 11 U/L (ref 0–44)
AST: 15 U/L (ref 15–41)
Albumin: 4.6 g/dL (ref 3.5–5.0)
Alkaline Phosphatase: 36 U/L — ABNORMAL LOW (ref 38–126)
Anion gap: 13 (ref 5–15)
BUN: 10 mg/dL (ref 6–20)
CO2: 24 mmol/L (ref 22–32)
Calcium: 9.5 mg/dL (ref 8.9–10.3)
Chloride: 100 mmol/L (ref 98–111)
Creatinine, Ser: 0.8 mg/dL (ref 0.44–1.00)
GFR calc Af Amer: >60 mL/min (ref >60)
GFR calc non Af Amer: >60 mL/min (ref >60)
Glucose, Bld: 99 mg/dL (ref 70–99)
Potassium: 3.3 mmol/L — ABNORMAL LOW (ref 3.5–5.1)
Sodium: 137 mmol/L (ref 135–145)
Total Bilirubin: 1.5 mg/dL — ABNORMAL HIGH (ref 0.3–1.2)
Total Protein: 7.8 g/dL (ref 6.5–8.1)

## 2020-04-08 LAB — URINALYSIS, COMPLETE (UACMP) WITH MICROSCOPIC
Bilirubin Urine: NEGATIVE
Glucose, UA: NEGATIVE mg/dL
Ketones, ur: 80 mg/dL — AB
Leukocytes,Ua: NEGATIVE
Nitrite: NEGATIVE
Protein, ur: 30 mg/dL — AB
Specific Gravity, Urine: 1.031 — ABNORMAL HIGH (ref 1.005–1.030)
pH: 5 (ref 5.0–8.0)

## 2020-04-08 LAB — LIPASE, BLOOD: Lipase: 23 U/L (ref 11–51)

## 2020-04-08 MED ORDER — IOHEXOL 9 MG/ML PO SOLN
1000.0000 mL | ORAL | Status: AC | PRN
  Administered 2020-04-08: 1000 mL via ORAL

## 2020-04-08 MED ORDER — TRAMADOL HCL 50 MG PO TABS: 50.0000 mg | ORAL_TABLET | Freq: Four times a day (QID) | ORAL | 0 refills | Status: AC | PRN

## 2020-04-08 MED ORDER — ONDANSETRON 8 MG PO TBDP: 8.0000 mg | ORAL_TABLET | Freq: Three times a day (TID) | ORAL | 0 refills | Status: AC | PRN

## 2020-04-08 MED ORDER — IOHEXOL 350 MG/ML SOLN
100.0000 mL | Freq: Once | INTRAVENOUS | Status: AC | PRN
  Administered 2020-04-08: 100 mL via INTRAVENOUS

## 2020-04-08 NOTE — ED Notes (Signed)
Patient transported to CT 

## 2020-04-08 NOTE — Discharge Instructions (Addendum)
Your CT scan shows stones in your gallbladder.  These can often cause pain especially after eating fatty foods.  You may take the pain and nausea medicines as needed.  The pain should subside over the next day.  If you continue to have episodes of pain like this, it is recommended that you follow-up with a general surgeon to consider having the gallbladder removed.  We have provided referral information.  Return to the ER immediately for new, worsening, or persistent severe abdominal pain, vomiting, fever, weakness, or any other new or worsening symptoms that concern you.

## 2020-04-08 NOTE — ED Notes (Signed)
CT notified pt can only drink one bottle contrast and ok to get pt when ready.

## 2020-04-08 NOTE — ED Notes (Signed)
Pt unable to sign d/c signature due to failed topaz. Pt verbalized understanding of d/c instructions

## 2020-04-08 NOTE — ED Provider Notes (Signed)
[facility] Emergency Department Provider Note ____________________________________________   First MD Initiated Contact with Patient 04/08/20 0740     (approximate)  I have reviewed the triage vital signs and the nursing notes.   HISTORY  Chief Complaint Abdominal Pain    HPI Erica Park is a 34 y.o. female with PMH as noted below including a history of an exporter laparotomy in 2008 and small bowel obstruction 2 weeks ago who presents with abdominal pain since yesterday, mainly in the upper abdomen, which resolved few hours ago, but is now starting to come back.  She states that she vomited once last night.  She has not had a bowel movement in 3 days but is passing gas.  She states the pain is similar to when she was diagnosed with an SBO last month.  Past Medical History:  Diagnosis Date  . Cocaine abuse (HCC)   . Psychosis (HCC)   . Suicide attempt [facility])     Patient Active Problem List   Diagnosis Date Noted  . SBO (small bowel obstruction) (HCC) 03/25/2020  . Abnormal finding on lung imaging 03/25/2020    Past Surgical History:  Procedure Laterality Date  . EXPLORATORY LAPAROTOMY  11/29/2006    Prior to Admission medications   Medication Sig Start Date End Date Taking? Authorizing Provider  ondansetron (ZOFRAN ODT) 8 MG disintegrating tablet Take 1 tablet (8 mg total) by mouth every 8 (eight) hours as needed for nausea or vomiting. 04/08/20   Dionne Bucy, MD  traMADol (ULTRAM) 50 MG tablet Take 1 tablet (50 mg total) by mouth every 6 (six) hours as needed for up to 5 days. 04/08/20 04/13/20  Dionne Bucy, MD    Allergies Patient has no known allergies.  No family history on file.  Social History Social History   Tobacco Use  . Smoking status: Never Smoker  . Smokeless tobacco: Never Used  Substance Use Topics  . Alcohol use: Yes    Comment: 1 beer every day  . Drug use: Yes    Types: Cocaine     Review of Systems  Constitutional: No fever/chills. Eyes: No redness. ENT: No sore throat. Cardiovascular: Denies chest pain. Respiratory: Denies shortness of breath. Gastrointestinal: Positive for resolved vomiting. Genitourinary: Negative for dysuria.  Musculoskeletal: Negative for back pain. Skin: Negative for rash. Neurological: Negative for headache.   ____________________________________________   PHYSICAL EXAM:  VITAL SIGNS: ED Triage Vitals  Enc Vitals Group     BP 04/07/20 2323 95/81     Pulse Rate 04/07/20 2323 91     Resp 04/07/20 2323 18     Temp 04/07/20 2323 99 F (37.2 C)     Temp Source 04/07/20 2323 Oral     SpO2 04/07/20 2323 100 %     Weight 04/07/20 2320 100 lb (45.4 kg)     Height 04/07/20 2320 5\' 4"  (1.626 m)     Head Circumference --      Peak Flow --      Pain Score 04/07/20 2320 10     Pain Loc --      Pain Edu? --      Excl. in GC? --     Constitutional: Alert and oriented. Well appearing and in no acute distress. Eyes: Conjunctivae are normal.  No scleral icterus. Head: Atraumatic. Nose: No congestion/rhinnorhea. Mouth/Throat: Mucous membranes are moist.   Neck: Normal range of motion.  Cardiovascular: Normal rate, regular rhythm.   Good peripheral circulation. Respiratory:  Normal respiratory effort.  No retractions.  Gastrointestinal: Soft with mild upper abdominal tenderness bilaterally.  No distention.  Genitourinary: No flank tenderness. Musculoskeletal: No lower extremity edema.  Extremities warm and well perfused.  Neurologic:  Normal speech and language. No gross focal neurologic deficits are appreciated.  Skin:  Skin is warm and dry. No rash noted. Psychiatric: Mood and affect are normal. Speech and behavior are normal.  ____________________________________________   LABS (all labs ordered are listed, but only abnormal results are displayed)  Labs Reviewed  COMPREHENSIVE METABOLIC PANEL - Abnormal; Notable for the  following components:      Result Value   Potassium 3.3 (*)    Alkaline Phosphatase 36 (*)    Total Bilirubin 1.5 (*)    All other components within normal limits  CBC - Abnormal; Notable for the following components:   Platelets 429 (*)    All other components within normal limits  URINALYSIS, COMPLETE (UACMP) WITH MICROSCOPIC - Abnormal; Notable for the following components:   Color, Urine YELLOW (*)    APPearance HAZY (*)    Specific Gravity, Urine 1.031 (*)    Hgb urine dipstick SMALL (*)    Ketones, ur 80 (*)    Protein, ur 30 (*)    Bacteria, UA RARE (*)    All other components within normal limits  LIPASE, BLOOD  POC URINE PREG, ED  POCT PREGNANCY, URINE   ____________________________________________  EKG   ____________________________________________  RADIOLOGY  CT abdomen/pelvis: Cholelithiasis without evidence of cholecystitis.  Bilateral ovarian cyst.  No other acute abnormality.  ____________________________________________   PROCEDURES  Procedure(s) performed: No  Procedures  Critical Care performed: No ____________________________________________   INITIAL IMPRESSION / ASSESSMENT AND PLAN / ED COURSE  Pertinent labs & imaging results that were available during my care of the patient were reviewed by me and considered in my medical decision making (see chart for details).  34 year old female with PMH as noted above including a remote history of an exploratory laparotomy and an SBO last month (treated nonoperatively) presents with recurrent abdominal pain since yesterday associated with constipation and one episode of vomiting.  The patient had to wait over 8 hours prior to being placed into an exam room.  She states that since she arrived the pain almost completely subsided and she fell asleep, however now it is starting to come back.  She has had no further vomiting.  I reviewed the past medical records in Epic.  The patient was admitted to the  hospital from 8/23-8/26 with an SBO treated with an NG tube.  She did not require surgery.  On exam, the patient is well-appearing.  Her vital signs are normal.  The abdomen is soft with mild bilateral upper quadrant tenderness and no significant distention.  Differential includes recurrent SBO, ileus, gastritis, acute gastroenteritis, or less likely hepatobiliary cause.  Lab work-up is unremarkable.  Although my suspicion for acute SBO is low given that the pain has mostly resolved and the patient has no distention or vomiting, given her elevated risk we will obtain a CT for further evaluation.  ----------------------------------------- 11:16 AM on 04/08/2020 -----------------------------------------  The patient's pain has continued to be minimal and she has not required any pain medication here.  She is tolerating p.o.  CT shows cholelithiasis but no other acute abnormalities.  There is no evidence of SBO.  The patient's pain could certainly be explained by biliary colic especially given its location and the fact that it seems to have subsided  on its own.  At this time, the patient is stable for discharge.  I counseled her on the results of the work-up.  I will prescribe some pain and nausea medication.  I have also given general surgery referral.  Return precautions given, and she expresses understanding.  ____________________________________________   FINAL CLINICAL IMPRESSION(S) / ED DIAGNOSES  Final diagnoses:  Pain of upper abdomen  Calculus of gallbladder without cholecystitis without obstruction      NEW MEDICATIONS STARTED DURING THIS VISIT:  New Prescriptions   ONDANSETRON (ZOFRAN ODT) 8 MG DISINTEGRATING TABLET    Take 1 tablet (8 mg total) by mouth every 8 (eight) hours as needed for nausea or vomiting.   TRAMADOL (ULTRAM) 50 MG TABLET    Take 1 tablet (50 mg total) by mouth every 6 (six) hours as needed for up to 5 days.     Note:  This document was prepared using  Dragon voice recognition software and may include unintentional dictation errors.    Dionne Bucy, MD 04/08/20 [phone]

## 2020-07-27 ENCOUNTER — Emergency Department: Payer: Self-pay

## 2020-07-27 ENCOUNTER — Encounter: Payer: Self-pay | Admitting: Radiology

## 2020-07-27 ENCOUNTER — Inpatient Hospital Stay
Admission: EM | Admit: 2020-07-27 | Discharge: 2020-07-28 | DRG: 390 | Discharge status: Against Medical Advice | Payer: Self-pay | Attending: Internal Medicine | Admitting: Internal Medicine

## 2020-07-27 ENCOUNTER — Other Ambulatory Visit: Payer: Self-pay

## 2020-07-27 DIAGNOSIS — R112 Nausea with vomiting, unspecified: Secondary | ICD-10-CM

## 2020-07-27 DIAGNOSIS — F250 Schizoaffective disorder, bipolar type: Secondary | ICD-10-CM | POA: Diagnosis present

## 2020-07-27 DIAGNOSIS — K458 Other specified abdominal hernia without obstruction or gangrene: Secondary | ICD-10-CM

## 2020-07-27 DIAGNOSIS — K56609 Unspecified intestinal obstruction, unspecified as to partial versus complete obstruction: Principal | ICD-10-CM | POA: Diagnosis present

## 2020-07-27 DIAGNOSIS — Z9151 Personal history of suicidal behavior: Secondary | ICD-10-CM

## 2020-07-27 DIAGNOSIS — I959 Hypotension, unspecified: Secondary | ICD-10-CM | POA: Diagnosis present

## 2020-07-27 DIAGNOSIS — Z818 Family history of other mental and behavioral disorders: Secondary | ICD-10-CM

## 2020-07-27 LAB — CBC
HCT: 42 % (ref 36.0–46.0)
Hemoglobin: 14 g/dL (ref 12.0–15.0)
MCH: 30.6 pg (ref 26.0–34.0)
MCHC: 33.3 g/dL (ref 30.0–36.0)
MCV: 91.9 fL (ref 80.0–100.0)
Platelets: 343 10*3/uL (ref 150–400)
RBC: 4.57 MIL/uL (ref 3.87–5.11)
RDW: 12.7 % (ref 11.5–15.5)
WBC: 11.3 10*3/uL — ABNORMAL HIGH (ref 4.0–10.5)
nRBC: 0 % (ref 0.0–0.2)

## 2020-07-27 LAB — COMPREHENSIVE METABOLIC PANEL
ALT: 11 U/L (ref 0–44)
AST: 18 U/L (ref 15–41)
Albumin: 5.1 g/dL — ABNORMAL HIGH (ref 3.5–5.0)
Alkaline Phosphatase: 36 U/L — ABNORMAL LOW (ref 38–126)
Anion gap: 15 (ref 5–15)
BUN: 11 mg/dL (ref 6–20)
CO2: 22 mmol/L (ref 22–32)
Calcium: 9.8 mg/dL (ref 8.9–10.3)
Chloride: 101 mmol/L (ref 98–111)
Creatinine, Ser: 0.67 mg/dL (ref 0.44–1.00)
GFR, Estimated: >60 mL/min (ref >60)
Glucose, Bld: 119 mg/dL — ABNORMAL HIGH (ref 70–99)
Potassium: 3.7 mmol/L (ref 3.5–5.1)
Sodium: 138 mmol/L (ref 135–145)
Total Bilirubin: 1.2 mg/dL (ref 0.3–1.2)
Total Protein: 8.8 g/dL — ABNORMAL HIGH (ref 6.5–8.1)

## 2020-07-27 LAB — LACTIC ACID, PLASMA: Lactic Acid, Venous: 1.4 mmol/L (ref 0.5–1.9)

## 2020-07-27 LAB — MAGNESIUM: Magnesium: 2.2 mg/dL (ref 1.7–2.4)

## 2020-07-27 LAB — LIPASE, BLOOD: Lipase: 20 U/L (ref 11–51)

## 2020-07-27 LAB — HCG, QUANTITATIVE, PREGNANCY: hCG, Beta Chain, Quant, S: <1 m[IU]/mL (ref <5)

## 2020-07-27 MED ORDER — ENOXAPARIN SODIUM 30 MG/0.3ML ~~LOC~~ SOLN
30.0000 mg | SUBCUTANEOUS | Status: DC
  Administered 2020-07-27: 30 mg via SUBCUTANEOUS
  Filled 2020-07-27 (×2): qty 0.3

## 2020-07-27 MED ORDER — ACETAMINOPHEN 325 MG PO TABS: 650.0000 mg | ORAL_TABLET | Freq: Four times a day (QID) | ORAL | Status: DC | PRN

## 2020-07-27 MED ORDER — SODIUM CHLORIDE 0.9 % IV BOLUS
1000.0000 mL | Freq: Once | INTRAVENOUS | Status: AC
  Administered 2020-07-27: 18:00:00 1000 mL via INTRAVENOUS

## 2020-07-27 MED ORDER — MORPHINE SULFATE (PF) 2 MG/ML IV SOLN
2.0000 mg | INTRAVENOUS | Status: DC | PRN
  Administered 2020-07-28: 2 mg via INTRAVENOUS
  Filled 2020-07-27: qty 1

## 2020-07-27 MED ORDER — MAGNESIUM HYDROXIDE 400 MG/5ML PO SUSP: 30.0000 mL | Freq: Every day | ORAL | Status: DC | PRN

## 2020-07-27 MED ORDER — MORPHINE SULFATE (PF) 4 MG/ML IV SOLN
4.0000 mg | Freq: Once | INTRAVENOUS | Status: AC
  Administered 2020-07-27: 18:00:00 4 mg via INTRAVENOUS
  Filled 2020-07-27: qty 1

## 2020-07-27 MED ORDER — ACETAMINOPHEN 650 MG RE SUPP: 650.0000 mg | Freq: Four times a day (QID) | RECTAL | Status: DC | PRN

## 2020-07-27 MED ORDER — MORPHINE SULFATE (PF) 4 MG/ML IV SOLN
4.0000 mg | Freq: Once | INTRAVENOUS | Status: AC
  Administered 2020-07-27: 21:00:00 4 mg via INTRAVENOUS
  Filled 2020-07-27: qty 1

## 2020-07-27 MED ORDER — IOHEXOL 300 MG/ML  SOLN
75.0000 mL | Freq: Once | INTRAMUSCULAR | Status: AC | PRN
  Administered 2020-07-27: 19:00:00 75 mL via INTRAVENOUS

## 2020-07-27 MED ORDER — ONDANSETRON 4 MG PO TBDP: 8.0000 mg | ORAL_TABLET | Freq: Three times a day (TID) | ORAL | Status: DC | PRN

## 2020-07-27 MED ORDER — TRAZODONE HCL 50 MG PO TABS: 25.0000 mg | ORAL_TABLET | Freq: Every evening | ORAL | Status: DC | PRN

## 2020-07-27 MED ORDER — ENOXAPARIN SODIUM 40 MG/0.4ML ~~LOC~~ SOLN: 40.0000 mg | SUBCUTANEOUS | Status: DC

## 2020-07-27 MED ORDER — SODIUM CHLORIDE 0.9 % IV SOLN: INTRAVENOUS | Status: DC

## 2020-07-27 MED ORDER — ONDANSETRON HCL 4 MG PO TABS: 4.0000 mg | ORAL_TABLET | Freq: Four times a day (QID) | ORAL | Status: DC | PRN

## 2020-07-27 MED ORDER — ONDANSETRON HCL 4 MG/2ML IJ SOLN: 4.0000 mg | Freq: Four times a day (QID) | INTRAMUSCULAR | Status: DC | PRN

## 2020-07-27 NOTE — ED Notes (Signed)
Pt given a half cup of ice.

## 2020-07-27 NOTE — ED Notes (Signed)
Pt refusing NG tube. RN attempted to explain the risks of refusing the NG tube. Pt still refusing. Mansy, MD notified. VSS. Awaiting further orders. Will continue to monitor.

## 2020-07-27 NOTE — ED Triage Notes (Signed)
Pt presents via EMS c/o abd pain for a few hours.

## 2020-07-27 NOTE — ED Notes (Signed)
Pt refusing in and out cath and states she cannot provide a urine sample at this time.

## 2020-07-27 NOTE — Progress Notes (Addendum)
34 yo female presents tonight with similar presentation and CT findings from last August.  H/o prior laparotomy in 2008.   Again presents with short h/o severe abdominal pain and associated vomiting.  Curently per Dr. Larinda Buttery much better controlled and calmed down.  Her abdominal exam was reportedly tender w/o distention.  Venous lactate wnl.  CT scan showing SBO with some swirling and transition point.   I have reviewed the imaging, and see no evidence of bowel wall edema or stranding.  Will again attempt NGT decompression, and repeat imaging in AM.  Will defer exploration at this time and allow for serial exams, as this has recurred w/in months, may need intervention unless complete resolution occurs.   Full consult to follow in AM, appreciate Hospitalist's admission of this challenging pt with h/o substance abuse, psychosis, etc.  Campbell Lerner, M.D., [facility]  07/27/2020 ; 8:20 PM

## 2020-07-27 NOTE — H&P (Addendum)
[place]   PATIENT NAME: Erica Park    MR#:  [id number]  DATE OF BIRTH:  1985-10-10  DATE OF ADMISSION:  07/27/2020  PRIMARY CARE PHYSICIAN: Patient, No Pcp Per   REQUESTING/REFERRING PHYSICIAN: Chesley Noon, MD  CHIEF COMPLAINT:   Chief Complaint  Patient presents with  . Abdominal Pain    HISTORY OF PRESENT ILLNESS:  Erica Park  is a 34 y.o. female with a known history of previous laparotomy in 2008 apparently for abdominal cyst, schizoaffective disorder bipolar type, and history of cocaine abuse, presented to [facility] of severe abdominal pain in the left lower quadrant and the left upper quadrant extending to the right lower quadrant with associated nausea and vomiting.  She admitted to diarrhea with loose bowel movement.  Her last bowel movement was this afternoon.  No fever or chills.  No bilious vomitus or hematemesis, melena or bright red bleeding per rectum.  No chest pain or dyspnea or cough or wheezing or hemoptysis.  Upon presentation to the emergency room, blood pressure was 82/61 with a pulse of 136 and respiratory to 24.  Blood pressure is improved with hydration to 95/83 over 79 heart rate was down to 88 then 100.  Labs revealed albumin 4.1 and troponin of 8.8 with otherwise unremarkable CMP.  Lactic acid was 1.4 and CBC showed minimal leukocytosis 11.3. EKG showed sinus tachycardia with rate 108 with acute exertion inferolaterally and prolonged QT interval with QTC 590 MS. beta hCG came back negative.  Abdominal and pelvic CT scan revealed the following: 1. Moderate grade small bowel obstruction with a transition point in the mid abdomen. At this location, there is swirling of the mesentery suggestive of an internal hernia. 2. Normal appendix. 3. Bilateral ovarian masses as detailed above. The left is a multiloculated cystic mass measuring approximately 5.6 cm. On the right there appears to be a 2.1 cm enhancing nodule. A  nonemergent outpatient ultrasound for the right-sided nodules recommended in 6-12 weeks. For the left-sided cystic mass, Follow-up by [place] is recommended in 6-12 months. Note: This recommendation does not apply to premenarchal patients and to those with increased risk (genetic, family history, elevated tumor markers or other high-risk factors) of ovarian cancer. Reference: JACR 2020 Feb; 17(2):248-254 4. There is cholelithiasis without secondary signs of acute cholecystitis. 5. Configuration of the uterus is suggestive of arcuate or septate Anatomy.  Dr. Claudine Mouton was notified about patient is aware.  The patient was given 4 mg of IV morphine sulfate twice and 1 L bolus of IV normal saline.  She will be admitted to a medical bed for further evaluation and management.   PAST MEDICAL HISTORY:   Past Medical History:  Diagnosis Date  . Cocaine abuse (HCC)   . Psychosis (HCC)   . Suicide attempt [facility])     PAST SURGICAL HISTORY:   Past Surgical History:  Procedure Laterality Date  . EXPLORATORY LAPAROTOMY  11/29/2006    SOCIAL HISTORY:   Social History   Tobacco Use  . Smoking status: Never Smoker  . Smokeless tobacco: Never Used  Substance Use Topics  . Alcohol use: Yes    Comment: 1 beer every day    FAMILY HISTORY:  Positive for schizophrenia in her brother  DRUG ALLERGIES:  No Known Allergies  REVIEW OF SYSTEMS:   ROS As per history of present illness. All pertinent systems were reviewed above. Constitutional, HEENT, cardiovascular, respiratory, GI, GU, musculoskeletal, neuro, psychiatric, endocrine, integumentary  and hematologic systems were reviewed and are otherwise negative/unremarkable except for positive findings mentioned above in the HPI.   MEDICATIONS AT HOME:   Prior to Admission medications   Medication Sig Start Date End Date Taking? Authorizing Provider  ondansetron (ZOFRAN ODT) 8 MG disintegrating tablet Take 1 tablet (8 mg total) by mouth every 8  (eight) hours as needed for nausea or vomiting. 04/08/20   Dionne Bucy, MD      VITAL SIGNS:  Blood pressure 104/73, pulse 88, resp. rate 16, height 5\' 3"  (1.6 m), weight 40.8 kg, SpO2 100 %.  PHYSICAL EXAMINATION:  Physical Exam  GENERAL:  34 y.o.-year-old patient lying in the bed with no acute distress.  EYES: Pupils equal, round, reactive to light and accommodation. No scleral icterus. Extraocular muscles intact.  HEENT: Head atraumatic, normocephalic. Oropharynx and nasopharynx clear.  NECK:  Supple, no jugular venous distention. No thyroid enlargement, no tenderness.  LUNGS: Normal breath sounds bilaterally, no wheezing, rales,rhonchi or crepitation. No use of accessory muscles of respiration.  CARDIOVASCULAR: Regular rate and rhythm, S1, S2 normal. No murmurs, rubs, or gallops.  ABDOMEN: Soft, nondistended with generalized abdominal tenderness mainly in the left lower and left upper quadrant without rebound tenderness guarding or rigidity.  Bowel sounds are significant diminished.  No organomegaly or mass.  EXTREMITIES: No pedal edema, cyanosis, or clubbing.  NEUROLOGIC: Cranial nerves II through XII are intact. Muscle strength 5/5 in all extremities. Sensation intact. Gait not checked.  PSYCHIATRIC: The patient is alert and oriented x 3.  Normal affect and good eye contact. SKIN: No obvious rash, lesion, or ulcer.   LABORATORY PANEL:   CBC Recent Labs  Lab 07/27/20 1726  WBC 11.3*  HGB 14.0  HCT 42.0  PLT 343   ------------------------------------------------------------------------------------------------------------------  Chemistries  Recent Labs  Lab 07/27/20 1726  NA 138  K 3.7  CL 101  CO2 22  GLUCOSE 119*  BUN 11  CREATININE 0.67  CALCIUM 9.8  MG 2.2  AST 18  ALT 11  ALKPHOS 36*  BILITOT 1.2   ------------------------------------------------------------------------------------------------------------------  Cardiac Enzymes No results for  input(s): TROPONINI in the last 168 hours. ------------------------------------------------------------------------------------------------------------------  RADIOLOGY:  CT Abdomen Pelvis W Contrast  Result Date: 07/27/2020 CLINICAL DATA:  Acute abdominal pain. EXAM: CT ABDOMEN AND PELVIS WITH CONTRAST TECHNIQUE: Multidetector CT imaging of the abdomen and pelvis was performed using the standard protocol following bolus administration of intravenous contrast. CONTRAST:  33mL OMNIPAQUE IOHEXOL 300 MG/ML  SOLN COMPARISON:  04/08/2020 FINDINGS: Lower chest: The lung bases are clear. The heart size is normal. Hepatobiliary: The liver is normal. Cholelithiasis without acute inflammation.There is no biliary ductal dilation. Pancreas: Normal contours without ductal dilatation. No peripancreatic fluid collection. Spleen: Unremarkable. Adrenals/Urinary Tract: --Adrenal glands: Unremarkable. --Right kidney/ureter: No hydronephrosis or radiopaque kidney stones. --Left kidney/ureter: No hydronephrosis or radiopaque kidney stones. --Urinary bladder: Urinary bladder is decompressed which limits evaluation. Stomach/Bowel: --Stomach/Duodenum: No hiatal hernia or other gastric abnormality. Normal duodenal course and caliber. --Small bowel: There are dilated loops of small bowel in the low abdomen with associated air-fluid levels. There is a distinct transition point in the mid abdomen at the level of mesenteric swirling. --Colon: Unremarkable. --Appendix: Normal. Vascular/Lymphatic: Normal course and caliber of the major abdominal vessels. --No retroperitoneal lymphadenopathy. --No mesenteric lymphadenopathy. --No pelvic or inguinal lymphadenopathy. Reproductive: There is an enlarging septated cystic mass involving the left ovary measuring approximately 5.6 cm (axial series 2, image 60). There is an apparent enhancing nodule in the region of  the right ovary measuring approximately 2.1 cm (axial series 2, image 59). The  configuration of the uterus is suggestive of a septate uterus or arcuate uterus. Other: No ascites or free air. The abdominal wall is normal. Musculoskeletal. No acute displaced fractures. IMPRESSION: 1. Moderate grade small bowel obstruction with a transition point in the mid abdomen. At this location, there is swirling of the mesentery suggestive of an internal hernia. 2. Normal appendix. 3. Bilateral ovarian masses as detailed above. The left is a multiloculated cystic mass measuring approximately 5.6 cm. On the right there appears to be a 2.1 cm enhancing nodule. A nonemergent outpatient ultrasound for the right-sided nodules recommended in 6-12 weeks. For the left-sided cystic mass, Follow-up by [place] is recommended in 6-12 months. Note: This recommendation does not apply to premenarchal patients and to those with increased risk (genetic, family history, elevated tumor markers or other high-risk factors) of ovarian cancer. Reference: JACR 2020 Feb; 17(2):248-254 4. There is cholelithiasis without secondary signs of acute cholecystitis. 5. Configuration of the uterus is suggestive of arcuate or septate anatomy. Electronically Signed   By: Katherine Mantle M.D.   On: 07/27/2020 19:32      IMPRESSION AND PLAN:  1.  Small bowel obstruction with internal hernia at transition point in the mid abdomen with associated abdominal pain and intractable nausea and vomiting. -Patient will be admitted to a medical bed. -She will be kept n.p.o. -Pain management will be provided. -We will follow flat and upright abdomen in a.m. -She refused an NG tube. -General surgery consultation will be obtained. -Dr. Claudine Mouton was notified and is aware about the patient.  2.  History of schizoaffective disorder, bipolar type.   -She is apparently on no medications for it. -We will place her on as needed IM Haldol  3.  DVT prophylaxis. -Subcutaneous Lovenox.   All the records are reviewed and case discussed with ED  provider. The plan of care was discussed in details with the patient (and family). I answered all questions. The patient agreed to proceed with the above mentioned plan. Further management will depend upon hospital course.   CODE STATUS: Full code  Status is: Inpatient  Remains inpatient appropriate because:Ongoing active pain requiring inpatient pain management, Ongoing diagnostic testing needed not appropriate for outpatient work up, Unsafe d/c plan, IV treatments appropriate due to intensity of illness or inability to take PO and Inpatient level of care appropriate due to severity of illness   Dispo: The patient is from: Home              Anticipated d/c is to: Home              Anticipated d/c date is: 2 days              Patient currently is not medically stable to d/c.    TOTAL TIME TAKING CARE OF THIS PATIENT: 55 minutes.    Hannah Beat M.D on 07/27/2020 at 8:34 PM  [facility]   From 7 PM-7 AM, contact night-coverage [email or web address]  CC: Primary care physician; Patient, No Pcp Per

## 2020-07-27 NOTE — ED Notes (Signed)
Per secure message by Arville Care, MD pt okay to have ice chips at this time.

## 2020-07-27 NOTE — Plan of Care (Signed)

## 2020-07-27 NOTE — ED Notes (Signed)
First Nurse Note: Pt to ED via EMS c/o generalized abd pain and N/V x 3 hours.

## 2020-07-27 NOTE — ED Notes (Signed)
Message sent to receiving RN on floor. Awaiting reply.

## 2020-07-27 NOTE — ED Provider Notes (Signed)
[facility] Emergency Department Provider Note ____________________________________________   Event Date/Time   First MD Initiated Contact with Patient 07/27/20 1730     (approximate)  I have reviewed the triage vital signs and the nursing notes.   HISTORY  Chief Complaint Abdominal Pain  HPI Erica Park is a 34 y.o. female with history of substance abuse, psychosis, suicide attempt, fentanyl overdose, and bizarre behavior presents to the emergency department for treatment and evaluation of abdominal pain with nausea and vomiting.         Past Medical History:  Diagnosis Date  . Cocaine abuse (HCC)   . Psychosis (HCC)   . Suicide attempt [facility])     Patient Active Problem List   Diagnosis Date Noted  . SBO (small bowel obstruction) (HCC) 03/25/2020  . Abnormal finding on lung imaging 03/25/2020    Past Surgical History:  Procedure Laterality Date  . EXPLORATORY LAPAROTOMY  11/29/2006    Prior to Admission medications   Medication Sig Start Date End Date Taking? Authorizing Provider  ondansetron (ZOFRAN ODT) 8 MG disintegrating tablet Take 1 tablet (8 mg total) by mouth every 8 (eight) hours as needed for nausea or vomiting. 04/08/20   Dionne Bucy, MD    Allergies Patient has no known allergies.  No family history on file.  Social History Social History   Tobacco Use  . Smoking status: Never Smoker  . Smokeless tobacco: Never Used  Substance Use Topics  . Alcohol use: Yes    Comment: 1 beer every day  . Drug use: Yes    Types: Cocaine    Review of Systems  Constitutional: No fever/chills Eyes: No visual changes. ENT: No sore throat. Cardiovascular: Denies chest pain. Respiratory: Denies shortness of breath. Gastrointestinal:Positive for nausea and vomiting. Genitourinary: Negative for dysuria. Musculoskeletal: Negative for back pain. Skin: Negative for rash. Neurological: Negative for headaches,  focal weakness or numbness. ____________________________________________   PHYSICAL EXAM:  VITAL SIGNS: ED Triage Vitals  Enc Vitals Group     BP 07/27/20 1701 (!) 82/61     Pulse Rate 07/27/20 1701 (!) 136     Resp 07/27/20 1701 (!) 24     Temp --      Temp src --      SpO2 07/27/20 1701 95 %     Weight 07/27/20 1703 90 lb (40.8 kg)     Height 07/27/20 1703 5\' 3"  (1.6 m)     Head Circumference --      Peak Flow --      Pain Score 07/27/20 1708 10     Pain Loc --      Pain Edu? --      Excl. in GC? --     Constitutional: Alert and oriented. Well appearing and in no acute distress. Eyes: Conjunctivae are normal. Head: Atraumatic. Nose: No congestion/rhinnorhea. Mouth/Throat: Mucous membranes are moist.  Oropharynx non-erythematous. Neck: No stridor.   Hematological/Lymphatic/Immunilogical: No cervical lymphadenopathy. Cardiovascular: Normal rate, regular rhythm. Grossly normal heart sounds.  Good peripheral circulation. Respiratory: Normal respiratory effort.  No retractions. Lungs CTAB. Gastrointestinal: Soft and diffusely tender. No distention. No abdominal bruits. Genitourinary:  Musculoskeletal: No lower extremity tenderness nor edema.  No joint effusions. Neurologic:  Normal speech and language. No gross focal neurologic deficits are appreciated. No gait instability. Skin:  Skin is warm, dry and intact. No rash noted. Mid abdominal surgical scar noted Psychiatric: Mood and affect are normal. Speech and behavior are normal.  ____________________________________________  LABS (all labs ordered are listed, but only abnormal results are displayed)  Labs Reviewed  COMPREHENSIVE METABOLIC PANEL - Abnormal; Notable for the following components:      Result Value   Glucose, Bld 119 (*)    Total Protein 8.8 (*)    Albumin 5.1 (*)    Alkaline Phosphatase 36 (*)    All other components within normal limits  CBC - Abnormal; Notable for the following components:   WBC  11.3 (*)    All other components within normal limits  LIPASE, BLOOD  HCG, QUANTITATIVE, PREGNANCY  MAGNESIUM  LACTIC ACID, PLASMA  URINALYSIS, COMPLETE (UACMP) WITH MICROSCOPIC  URINE DRUG SCREEN, QUALITATIVE ([facility] ONLY)  BASIC METABOLIC PANEL  CBC  POC URINE PREG, ED   ____________________________________________  EKG  ED ECG REPORT I, Jeanise Durfey, FNP-BC personally viewed and interpreted this ECG.   Date: 07/27/2020  Rate: 113  Rhythm: sinus tachycardia with prolonged QT segment.  Axis: normal  Intervals:none  ST&T Change: no ST change  ____________________________________________  RADIOLOGY  ED MD interpretation:    Moderate bowel obstruction and possible internal hernia on CT.  I, Emony Dormer, personally viewed and evaluated these images (plain radiographs) as part of my medical decision making, as well as reviewing the written report by the radiologist.  Official radiology report(s): CT Abdomen Pelvis W Contrast  Result Date: 07/27/2020 CLINICAL DATA:  Acute abdominal pain. EXAM: CT ABDOMEN AND PELVIS WITH CONTRAST TECHNIQUE: Multidetector CT imaging of the abdomen and pelvis was performed using the standard protocol following bolus administration of intravenous contrast. CONTRAST:  51mL OMNIPAQUE IOHEXOL 300 MG/ML  SOLN COMPARISON:  04/08/2020 FINDINGS: Lower chest: The lung bases are clear. The heart size is normal. Hepatobiliary: The liver is normal. Cholelithiasis without acute inflammation.There is no biliary ductal dilation. Pancreas: Normal contours without ductal dilatation. No peripancreatic fluid collection. Spleen: Unremarkable. Adrenals/Urinary Tract: --Adrenal glands: Unremarkable. --Right kidney/ureter: No hydronephrosis or radiopaque kidney stones. --Left kidney/ureter: No hydronephrosis or radiopaque kidney stones. --Urinary bladder: Urinary bladder is decompressed which limits evaluation. Stomach/Bowel: --Stomach/Duodenum: No hiatal hernia or other  gastric abnormality. Normal duodenal course and caliber. --Small bowel: There are dilated loops of small bowel in the low abdomen with associated air-fluid levels. There is a distinct transition point in the mid abdomen at the level of mesenteric swirling. --Colon: Unremarkable. --Appendix: Normal. Vascular/Lymphatic: Normal course and caliber of the major abdominal vessels. --No retroperitoneal lymphadenopathy. --No mesenteric lymphadenopathy. --No pelvic or inguinal lymphadenopathy. Reproductive: There is an enlarging septated cystic mass involving the left ovary measuring approximately 5.6 cm (axial series 2, image 60). There is an apparent enhancing nodule in the region of the right ovary measuring approximately 2.1 cm (axial series 2, image 59). The configuration of the uterus is suggestive of a septate uterus or arcuate uterus. Other: No ascites or free air. The abdominal wall is normal. Musculoskeletal. No acute displaced fractures. IMPRESSION: 1. Moderate grade small bowel obstruction with a transition point in the mid abdomen. At this location, there is swirling of the mesentery suggestive of an internal hernia. 2. Normal appendix. 3. Bilateral ovarian masses as detailed above. The left is a multiloculated cystic mass measuring approximately 5.6 cm. On the right there appears to be a 2.1 cm enhancing nodule. A nonemergent outpatient ultrasound for the right-sided nodules recommended in 6-12 weeks. For the left-sided cystic mass, Follow-up by [place] is recommended in 6-12 months. Note: This recommendation does not apply to premenarchal patients and to those with increased risk (genetic,  family history, elevated tumor markers or other high-risk factors) of ovarian cancer. Reference: JACR 2020 Feb; 17(2):248-254 4. There is cholelithiasis without secondary signs of acute cholecystitis. 5. Configuration of the uterus is suggestive of arcuate or septate anatomy. Electronically Signed   By: Katherine Mantle M.D.    On: 07/27/2020 19:32    ____________________________________________   PROCEDURES  Procedure(s) performed (including Critical Care):  Procedures  ____________________________________________   INITIAL IMPRESSION / ASSESSMENT AND PLAN     34 year old female presenting to the emergency department for treatment and evaluation of abdominal pain.  See HPI for further details.  Plan will be to get labs, urinalysis, and CT of the abdomen pelvis with contrast.  She does have a history of small bowel obstruction after a laparotomy in 2008.  DIFFERENTIAL DIAGNOSIS  Small bowel obstruction, colitis, gastroenteritis  ED COURSE  Initially tachycardic and mildly hypotensive. IV fluids given with improvement of both. Morphine has pain fairly well controlled.   Small bowel obstruction identified on CT. Plan will be for Dr. Larinda Buttery to discuss with surgeon and hospitalist for admission.     ___________________________________________   FINAL CLINICAL IMPRESSION(S) / ED DIAGNOSES  Final diagnoses:  SBO (small bowel obstruction) [facility])     ED Discharge Orders    None       Erica Park was evaluated in Emergency Department on 07/27/2020 for the symptoms described in the history of present illness. She was evaluated in the context of the global COVID-19 pandemic, which necessitated consideration that the patient might be at risk for infection with the SARS-CoV-2 virus that causes COVID-19. Institutional protocols and algorithms that pertain to the evaluation of patients at risk for COVID-19 are in a state of rapid change based on information released by regulatory bodies including the CDC and federal and state organizations. These policies and algorithms were followed during the patient's care in the ED.   Note:  This document was prepared using Dragon voice recognition software and may include unintentional dictation errors.   Chinita Pester, FNP 07/27/20 2101     Chesley Noon, MD 07/27/20 2328

## 2020-07-27 NOTE — ED Notes (Signed)
Pt given phone to use 

## 2020-07-27 NOTE — ED Provider Notes (Signed)
-----------------------------------------   8:02 PM on 07/27/2020 -----------------------------------------  Patient evaluated by me separately from the NP [facility]tt.  Patient presenting for severe diffuse abdominal pain with nausea and vomiting starting earlier today.  She has a history of laparotomy in 2008 with bowel obstruction since then, also with history of schizophrenia and history is limited.  Patient with severe diffuse abdominal tenderness on exam.  Pregnancy testing is negative labs are reassuring with lactic acid within normal limits.  CT scan shows moderate severity bowel obstruction with possible internal hernia.  Case discussed with Dr. Claudine Mouton, who will review CT images but does not expect surgical intervention.  Patient continues to have significant pain and we will place NG tube.  Case discussed with hospitalist for admission.   Chesley Noon, MD 07/27/20 2053

## 2020-07-28 ENCOUNTER — Inpatient Hospital Stay: Payer: Self-pay

## 2020-07-28 DIAGNOSIS — F250 Schizoaffective disorder, bipolar type: Secondary | ICD-10-CM

## 2020-07-28 DIAGNOSIS — K56609 Unspecified intestinal obstruction, unspecified as to partial versus complete obstruction: Principal | ICD-10-CM

## 2020-07-28 LAB — CBC
HCT: 32.9 % — ABNORMAL LOW (ref 36.0–46.0)
Hemoglobin: 10.9 g/dL — ABNORMAL LOW (ref 12.0–15.0)
MCH: 30.7 pg (ref 26.0–34.0)
MCHC: 33.1 g/dL (ref 30.0–36.0)
MCV: 92.7 fL (ref 80.0–100.0)
Platelets: 260 10*3/uL (ref 150–400)
RBC: 3.55 MIL/uL — ABNORMAL LOW (ref 3.87–5.11)
RDW: 12.9 % (ref 11.5–15.5)
WBC: 12.1 10*3/uL — ABNORMAL HIGH (ref 4.0–10.5)
nRBC: 0 % (ref 0.0–0.2)

## 2020-07-28 LAB — BASIC METABOLIC PANEL
Anion gap: 6 (ref 5–15)
BUN: 8 mg/dL (ref 6–20)
CO2: 23 mmol/L (ref 22–32)
Calcium: 8.6 mg/dL — ABNORMAL LOW (ref 8.9–10.3)
Chloride: 107 mmol/L (ref 98–111)
Creatinine, Ser: 0.5 mg/dL (ref 0.44–1.00)
GFR, Estimated: >60 mL/min (ref >60)
Glucose, Bld: 78 mg/dL (ref 70–99)
Potassium: 3.3 mmol/L — ABNORMAL LOW (ref 3.5–5.1)
Sodium: 136 mmol/L (ref 135–145)

## 2020-07-28 MED ORDER — COVID-19 MRNA VACCINE (PFIZER) 30 MCG/0.3ML IM SUSP: 0.3000 mL | Freq: Once | INTRAMUSCULAR | Status: DC

## 2020-07-28 NOTE — Progress Notes (Signed)
1        [place] [facility]   PATIENT NAME: Erica Park    MR#:  [id number]  DATE OF BIRTH:  January 25, 1986  SUBJECTIVE:  CHIEF COMPLAINT:   Chief Complaint  Patient presents with  . Abdominal Pain  She is requesting ginger ale or clear liquids.  Denies any abdominal pain.  Remains hypotensive.  Agreeable for Covid 19 vaccination as she has not had any. REVIEW OF SYSTEMS:  Review of Systems  Constitutional: Negative for diaphoresis, fever, malaise/fatigue and weight loss.  HENT: Negative for ear discharge, ear pain, hearing loss, nosebleeds, sore throat and tinnitus.   Eyes: Negative for blurred vision and pain.  Respiratory: Negative for cough, hemoptysis, shortness of breath and wheezing.   Cardiovascular: Negative for chest pain, palpitations, orthopnea and leg swelling.  Gastrointestinal: Negative for abdominal pain, blood in stool, constipation, diarrhea, heartburn, nausea and vomiting.  Genitourinary: Negative for dysuria, frequency and urgency.  Musculoskeletal: Negative for back pain and myalgias.  Skin: Negative for itching and rash.  Neurological: Negative for dizziness, tingling, tremors, focal weakness, seizures, weakness and headaches.  Psychiatric/Behavioral: Negative for depression. The patient is not nervous/anxious.    DRUG ALLERGIES:  No Known Allergies VITALS:  Blood pressure (!) 87/63, pulse 80, temperature 98.2 F (36.8 C), temperature source Oral, resp. rate 16, height 5\' 3"  (1.6 m), weight 40.8 kg, SpO2 100 %. PHYSICAL EXAMINATION:  Physical Exam HENT:     Head: Normocephalic and atraumatic.  Eyes:     Extraocular Movements: EOM normal.     Conjunctiva/sclera: Conjunctivae normal.     Pupils: Pupils are equal, round, and reactive to light.  Neck:     Thyroid: No thyromegaly.     Trachea: No tracheal deviation.  Cardiovascular:     Rate and Rhythm: Normal rate and regular rhythm.     Heart sounds: Normal heart sounds.   Pulmonary:     Effort: Pulmonary effort is normal. No respiratory distress.     Breath sounds: Normal breath sounds. No wheezing.  Chest:     Chest wall: No tenderness.  Abdominal:     General: Bowel sounds are normal. There is no distension.     Palpations: Abdomen is soft.     Tenderness: There is no abdominal tenderness.  Musculoskeletal:        General: Normal range of motion.     Cervical back: Normal range of motion and neck supple.  Skin:    General: Skin is warm and dry.     Findings: No rash.  Neurological:     Mental Status: She is alert and oriented to person, place, and time.     Cranial Nerves: No cranial nerve deficit.    LABORATORY PANEL:  Female CBC Recent Labs  Lab 07/28/20 0846  WBC 12.1*  HGB 10.9*  HCT 32.9*  PLT 260   ------------------------------------------------------------------------------------------------------------------ Chemistries  Recent Labs  Lab 07/27/20 1726 07/28/20 0846  NA 138 136  K 3.7 3.3*  CL 101 107  CO2 22 23  GLUCOSE 119* 78  BUN 11 8  CREATININE 0.67 0.50  CALCIUM 9.8 8.6*  MG 2.2  --   AST 18  --   ALT 11  --   ALKPHOS 36*  --   BILITOT 1.2  --    RADIOLOGY:  CT Abdomen Pelvis W Contrast  Result Date: 07/27/2020 CLINICAL DATA:  Acute abdominal pain. EXAM: CT ABDOMEN AND PELVIS WITH CONTRAST TECHNIQUE: Multidetector CT imaging of  the abdomen and pelvis was performed using the standard protocol following bolus administration of intravenous contrast. CONTRAST:  66mL OMNIPAQUE IOHEXOL 300 MG/ML  SOLN COMPARISON:  04/08/2020 FINDINGS: Lower chest: The lung bases are clear. The heart size is normal. Hepatobiliary: The liver is normal. Cholelithiasis without acute inflammation.There is no biliary ductal dilation. Pancreas: Normal contours without ductal dilatation. No peripancreatic fluid collection. Spleen: Unremarkable. Adrenals/Urinary Tract: --Adrenal glands: Unremarkable. --Right kidney/ureter: No hydronephrosis or  radiopaque kidney stones. --Left kidney/ureter: No hydronephrosis or radiopaque kidney stones. --Urinary bladder: Urinary bladder is decompressed which limits evaluation. Stomach/Bowel: --Stomach/Duodenum: No hiatal hernia or other gastric abnormality. Normal duodenal course and caliber. --Small bowel: There are dilated loops of small bowel in the low abdomen with associated air-fluid levels. There is a distinct transition point in the mid abdomen at the level of mesenteric swirling. --Colon: Unremarkable. --Appendix: Normal. Vascular/Lymphatic: Normal course and caliber of the major abdominal vessels. --No retroperitoneal lymphadenopathy. --No mesenteric lymphadenopathy. --No pelvic or inguinal lymphadenopathy. Reproductive: There is an enlarging septated cystic mass involving the left ovary measuring approximately 5.6 cm (axial series 2, image 60). There is an apparent enhancing nodule in the region of the right ovary measuring approximately 2.1 cm (axial series 2, image 59). The configuration of the uterus is suggestive of a septate uterus or arcuate uterus. Other: No ascites or free air. The abdominal wall is normal. Musculoskeletal. No acute displaced fractures. IMPRESSION: 1. Moderate grade small bowel obstruction with a transition point in the mid abdomen. At this location, there is swirling of the mesentery suggestive of an internal hernia. 2. Normal appendix. 3. Bilateral ovarian masses as detailed above. The left is a multiloculated cystic mass measuring approximately 5.6 cm. On the right there appears to be a 2.1 cm enhancing nodule. A nonemergent outpatient ultrasound for the right-sided nodules recommended in 6-12 weeks. For the left-sided cystic mass, Follow-up by [place] is recommended in 6-12 months. Note: This recommendation does not apply to premenarchal patients and to those with increased risk (genetic, family history, elevated tumor markers or other high-risk factors) of ovarian cancer. Reference:  JACR 2020 Feb; 17(2):248-254 4. There is cholelithiasis without secondary signs of acute cholecystitis. 5. Configuration of the uterus is suggestive of arcuate or septate anatomy. Electronically Signed   By: Katherine Mantle M.D.   On: 07/27/2020 19:32   DG Abd 2 Views  Result Date: 07/28/2020 CLINICAL DATA:  Small bowel obstruction. EXAM: ABDOMEN - 2 VIEW COMPARISON:  CT scan 07/27/2020. FINDINGS: Upright film shows no evidence for intraperitoneal free air. Mild small bowel distention in the left abdomen measures up to 4.3 cm. Paucity of colonic gas again noted. Visualized bony anatomy unremarkable. IMPRESSION: Persistent small bowel dilatation in the left abdomen with paucity of colonic gas. Electronically Signed   By: Kennith Center M.D.   On: 07/28/2020 07:56   ASSESSMENT AND PLAN:  34 year old female with a history of previous laparotomy, schizoaffective disorder, bipolar disorder, history of cocaine abuse is admitted for small bowel obstruction  Small bowel obstruction Conservative management.  Surgery following.  Appreciate their input Patient refusing NG tube decompression Ice chips for now advance as per surgery recommendations  Schizoaffective disorder, bipolar disorder Stable on her medications   Body mass index is 15.94 kg/m.      Status is: Inpatient  Remains inpatient appropriate because:Inpatient level of care appropriate due to severity of illness   Dispo: The patient is from: Home  Anticipated d/c is to: Home              Anticipated d/c date is: 2 days              Patient currently is not medically stable to d/c.    DVT prophylaxis:       enoxaparin (LOVENOX) injection 30 mg Start: 07/27/20 2100     Family Communication: ("discussed with patient")   All the records are reviewed and case discussed with Care Management/Social Worker. Management plans discussed with the patient, nursing and they are in agreement.  CODE STATUS: Full  Code  TOTAL TIME TAKING CARE OF THIS PATIENT: 35 minutes.   More than 50% of the time was spent in counseling/coordination of care: YES  POSSIBLE D/C IN 1-2 DAYS, DEPENDING ON CLINICAL CONDITION.   Delfino Lovett M.D on 07/28/2020 at 3:17 PM  [facility]   CC: Primary care physician; Patient, No Pcp Per  Note: This dictation was prepared with Dragon dictation along with smaller phrase technology. Any transcriptional errors that result from this process are unintentional.

## 2020-07-28 NOTE — Progress Notes (Signed)
SURGICAL PROGRESS NOTE   Hospital Day(s): 1.   Post op day(s):  Marland Kitchen   Interval History: Patient seen and examined, no acute events or new complaints overnight. Patient reports feeling okay.  She denies abdominal pain.  She denies nausea or vomiting.  Patient was very sleepy and drowsy upon my evaluation but she was arousable but she went into the back to sleep.  Vital signs in last 24 hours: [min-max] current  Temp:  [98.8 F (37.1 C)-99.1 F (37.3 C)] 99.1 F (37.3 C) (12/26 1001) Pulse Rate:  [63-136] 63 (12/26 1001) Resp:  [15-24] 20 (12/26 1001) BP: (82-109)/(57-79) 94/57 (12/26 1001) SpO2:  [95 %-100 %] 99 % (12/26 1001) Weight:  [40.8 kg] 40.8 kg (12/25 1703)     Height: 5\' 3"  (160 cm) Weight: 40.8 kg BMI (Calculated): 15.95   Physical Exam:  Constitutional: alert, cooperative and no distress  Respiratory: breathing non-labored at rest  Cardiovascular: regular rate and sinus rhythm  Gastrointestinal: soft, non-tender, and non-distended  Labs:  CBC Latest Ref Rng & Units 07/28/2020 07/27/2020 04/07/2020  WBC 4.0 - 10.5 K/uL 12.1(H) 11.3(H) 9.4  Hemoglobin 12.0 - 15.0 g/dL 10.9(L) 14.0 12.3  Hematocrit 36.0 - 46.0 % 32.9(L) 42.0 36.7  Platelets 150 - 400 K/uL 260 343 429(H)   CMP Latest Ref Rng & Units 07/28/2020 07/27/2020 04/07/2020  Glucose 70 - 99 mg/dL 78 06/07/2020) 99  BUN 6 - 20 mg/dL 8 11 10   Creatinine 0.44 - 1.00 mg/dL 932(I 7.12  Sodium 135 - 145 mmol/L 136 138 137  Potassium 3.5 - 5.1 mmol/L 3.3(L) 3.7 3.3(L)  Chloride 98 - 111 mmol/L 107 101 100  CO2 22 - 32 mmol/L 23 22 24   Calcium 8.9 - 10.3 mg/dL 4.58) 9.8 9.5  Total Protein 6.5 - 8.1 g/dL - 8.8(H) 7.8  Total Bilirubin 0.3 - 1.2 mg/dL - 1.2 0.99)  Alkaline Phos 38 - 126 U/L - 36(L) 36(L)  AST 15 - 41 U/L - 18 15  ALT 0 - 44 U/L - 11 11    Imaging studies  I personally evaluated the abdominal x-ray.  There is a segment of the small bowel dilated in the left lower quadrant.  No free air no  significant proximal small bowel dilation.   Assessment/Plan:  34 y.o. female with small bowel obstruction, complicated by pertinent comorbidities including history of cocaine abuse and depression.  Patient presenting with small bowel obstruction.  At the moment of my evaluation there was no abdominal pain.  Abdominal exam was benign.  Patient refusing nasogastric tube decompression.  She was oriented about the increased risk of perforation if bowel obstruction does not resolve.  We will continue conservative management with IV hydration, keeping electrolytes within normal limits, physical exam and abdominal x-ray tomorrow.   8.3(J, MD

## 2020-07-28 NOTE — Progress Notes (Signed)
Patient refused lab work this morning per phlebotomist report. Phlebotomist stated she will come back later,but she can not verify exact time of return.

## 2020-07-28 NOTE — Progress Notes (Signed)
Nurse notified that patient decided to leave AMA.

## 2020-07-29 NOTE — Discharge Summary (Signed)
Patient decided to lave AMA despite clear understanding that she can get clinically worse and may rupture her bowels. She understood the risks for leaving AMA including possible death. Instructed to come back to ED if need.

## 2020-10-06 ENCOUNTER — Encounter: Payer: Self-pay | Admitting: Emergency Medicine

## 2020-10-06 ENCOUNTER — Emergency Department
Admission: EM | Admit: 2020-10-06 | Discharge: 2020-10-06 | Disposition: A | Discharge status: Home / Self Care | Payer: Self-pay | Attending: Emergency Medicine | Admitting: Emergency Medicine

## 2020-10-06 DIAGNOSIS — R109 Unspecified abdominal pain: Secondary | ICD-10-CM | POA: Insufficient documentation

## 2020-10-06 DIAGNOSIS — F1410 Cocaine abuse, uncomplicated: Secondary | ICD-10-CM

## 2020-10-06 DIAGNOSIS — M791 Myalgia, unspecified site: Secondary | ICD-10-CM | POA: Insufficient documentation

## 2020-10-06 DIAGNOSIS — F1420 Cocaine dependence, uncomplicated: Secondary | ICD-10-CM | POA: Insufficient documentation

## 2020-10-06 LAB — CBC WITH DIFFERENTIAL/PLATELET
Abs Immature Granulocytes: 0.04 10*3/uL (ref 0.00–0.07)
Basophils Absolute: 0 10*3/uL (ref 0.0–0.1)
Basophils Relative: 0 %
Eosinophils Absolute: 0 10*3/uL (ref 0.0–0.5)
Eosinophils Relative: 0 %
HCT: 37.5 % (ref 36.0–46.0)
Hemoglobin: 12.5 g/dL (ref 12.0–15.0)
Immature Granulocytes: 0 %
Lymphocytes Relative: 9 %
Lymphs Abs: 1.2 10*3/uL (ref 0.7–4.0)
MCH: 31.3 pg (ref 26.0–34.0)
MCHC: 33.3 g/dL (ref 30.0–36.0)
MCV: 93.8 fL (ref 80.0–100.0)
Monocytes Absolute: 0.4 10*3/uL (ref 0.1–1.0)
Monocytes Relative: 4 %
Neutro Abs: 11 10*3/uL — ABNORMAL HIGH (ref 1.7–7.7)
Neutrophils Relative %: 87 %
Platelets: 384 10*3/uL (ref 150–400)
RBC: 4 MIL/uL (ref 3.87–5.11)
RDW: 13.2 % (ref 11.5–15.5)
WBC: 12.7 10*3/uL — ABNORMAL HIGH (ref 4.0–10.5)
nRBC: 0 % (ref 0.0–0.2)

## 2020-10-06 LAB — COMPREHENSIVE METABOLIC PANEL
ALT: 11 U/L (ref 0–44)
AST: 17 U/L (ref 15–41)
Albumin: 4.6 g/dL (ref 3.5–5.0)
Alkaline Phosphatase: 40 U/L (ref 38–126)
Anion gap: 13 (ref 5–15)
BUN: 11 mg/dL (ref 6–20)
CO2: 24 mmol/L (ref 22–32)
Calcium: 9.6 mg/dL (ref 8.9–10.3)
Chloride: 102 mmol/L (ref 98–111)
Creatinine, Ser: 0.64 mg/dL (ref 0.44–1.00)
GFR, Estimated: >60 mL/min (ref >60)
Glucose, Bld: 102 mg/dL — ABNORMAL HIGH (ref 70–99)
Potassium: 3.6 mmol/L (ref 3.5–5.1)
Sodium: 139 mmol/L (ref 135–145)
Total Bilirubin: 1 mg/dL (ref 0.3–1.2)
Total Protein: 7.7 g/dL (ref 6.5–8.1)

## 2020-10-06 LAB — LIPASE, BLOOD: Lipase: 25 U/L (ref 11–51)

## 2020-10-06 LAB — HCG, QUANTITATIVE, PREGNANCY: hCG, Beta Chain, Quant, S: 1 m[IU]/mL (ref <5)

## 2020-10-06 MED ORDER — DROPERIDOL 2.5 MG/ML IJ SOLN
5.0000 mg | Freq: Once | INTRAMUSCULAR | Status: AC
  Administered 2020-10-06: 5 mg via INTRAMUSCULAR

## 2020-10-06 MED ORDER — ACETAMINOPHEN 500 MG PO TABS
1000.0000 mg | ORAL_TABLET | Freq: Once | ORAL | Status: AC
  Administered 2020-10-06: 1000 mg via ORAL
  Filled 2020-10-06: qty 2

## 2020-10-06 NOTE — ED Notes (Signed)
Dr. Derrill Kay at bedside attempting to reevaluate.

## 2020-10-06 NOTE — ED Notes (Signed)
Pt speaking to mom on phone at this time, pt educated to ask mom to pick her up for D/C, pt states mom will be here soon.

## 2020-10-06 NOTE — ED Provider Notes (Signed)
[facility] Emergency Department Provider Note  ____________________________________________   Event Date/Time   First MD Initiated Contact with Patient 10/06/20 [phone]     (approximate)  I have reviewed the triage vital signs and the nursing notes.   HISTORY  Chief Complaint Pain  Level 5 caveat: Patient is a vague historian and history is limited due to both cooperation and likely due to intoxication.  HPI Erica Park is a 35 y.o. female with psychiatric history as listed below who presents voluntarily  for evaluation of pain over her whole body.  She has no specific complaints, just that she is requesting morphine.  Promptly upon being placed in a bed the patient fell asleep and slept for nearly 6 hours, protecting her airway and without any complaints or concerns.  After long sleep, the patient woke up and asked to speak with me and her nurse.  She is reporting abdominal pain.  She said that she had some vomiting couple days ago.  She admits to smoking crack for the last 3 days and not having very much to eat or drink.  She is unwilling or unable to describe the abdominal pain, she states "it just hurts and I need pain medicine".  She denies chest pain or shortness of breath.  She denies any other drug use except for cocaine.        Past Medical History:  Diagnosis Date  . Cocaine abuse (HCC)   . Psychosis (HCC)   . Suicide attempt [facility])     Patient Active Problem List   Diagnosis Date Noted  . SBO (small bowel obstruction) (HCC) 03/25/2020  . Abnormal finding on lung imaging 03/25/2020    Past Surgical History:  Procedure Laterality Date  . EXPLORATORY LAPAROTOMY  11/29/2006    Prior to Admission medications   Medication Sig Start Date End Date Taking? Authorizing Provider  ondansetron (ZOFRAN ODT) 8 MG disintegrating tablet Take 1 tablet (8 mg total) by mouth every 8 (eight) hours as needed for nausea or vomiting. 04/08/20    Dionne Bucy, MD    Allergies Patient has no known allergies.  History reviewed. No pertinent family history.  Social History Social History   Tobacco Use  . Smoking status: Never Smoker  . Smokeless tobacco: Never Used  Substance Use Topics  . Alcohol use: Yes    Comment: 1 beer every day  . Drug use: Yes    Types: Cocaine    Review of Systems Level 5 caveat: Patient is a vague historian and history is limited due to both cooperation and likely due to intoxication.  Constitutional: No fever/chills Cardiovascular: Denies chest pain. Respiratory: Denies shortness of breath. Gastrointestinal: Patient reports generalized abdominal pain and had some nausea and vomiting a few days ago. Genitourinary: Negative for dysuria. Musculoskeletal: Negative for neck pain.  Negative for back pain. Integumentary: Negative for rash. Neurological: Negative for headaches, focal weakness or numbness. Psych: admits to 3 days of crack cocaine abuse   ____________________________________________   PHYSICAL EXAM:  VITAL SIGNS: ED Triage Vitals  Enc Vitals Group     BP 10/06/20 0116 99/80     Pulse Rate 10/06/20 0116 86     Resp 10/06/20 0116 12     Temp 10/06/20 0116 97.9 F (36.6 C)     Temp Source 10/06/20 0116 Oral     SpO2 10/06/20 0116 100 %     Weight --      Height --  Head Circumference --      Peak Flow --      Pain Score 10/06/20 0118 10     Pain Loc --      Pain Edu? --      Excl. in GC? --     Constitutional: Alert and oriented.  Eyes: Conjunctivae are normal.  Head: Atraumatic. Nose: No congestion/rhinnorhea. Mouth/Throat: Patient is wearing a mask. Neck: No stridor.  No meningeal signs.   Cardiovascular: Normal rate, regular rhythm. Good peripheral circulation. Respiratory: Normal respiratory effort.  No retractions. Gastrointestinal: Thin habitus. Non-distended. Patient guards whenever I approach her and I cannot get a good exam, as soon as I touch  her abdomen to palpate she says that it hurts and that she wants pain medicine. Musculoskeletal: No lower extremity tenderness nor edema. No gross deformities of extremities. Neurologic:  Normal speech and language. No gross focal neurologic deficits are appreciated.  Skin:  Skin is warm, dry and intact. Psychiatric: Mood and affect somewhat pressured and labile.  She is somewhat verbally argumentative but not combative.  Admits to drug abuse but is not responding to internal stimuli or having other signs of acute psychosis.  ____________________________________________   LABS (all labs ordered are listed, but only abnormal results are displayed)  Labs Reviewed  CBC WITH DIFFERENTIAL/PLATELET - Abnormal; Notable for the following components:      Result Value   WBC 12.7 (*)    Neutro Abs 11.0 (*)    All other components within normal limits  COMPREHENSIVE METABOLIC PANEL - Abnormal; Notable for the following components:   Glucose, Bld 102 (*)    All other components within normal limits  LIPASE, BLOOD  HCG, QUANTITATIVE, PREGNANCY  URINALYSIS, ROUTINE W REFLEX MICROSCOPIC   ____________________________________________  EKG  ED ECG REPORT I, Loleta Rose, the attending physician, personally viewed and interpreted this ECG.  Date: 10/06/2020 EKG Time: 7:18 AM Rate: 85 Rhythm: normal sinus rhythm QRS Axis: normal Intervals: normal, QTc intervals 43 ms but improved from prior. ST/T Wave abnormalities: Non-specific ST segment / T-wave changes, but no clear evidence of acute ischemia. Narrative Interpretation: no definitive evidence of acute ischemia; does not meet STEMI criteria.   ____________________________________________  RADIOLOGY No emergent imaging  ____________________________________________   PROCEDURES   Procedure(s) performed (including Critical Care):  Procedures   ____________________________________________   INITIAL IMPRESSION / MDM / ASSESSMENT  AND PLAN / ED COURSE  As part of my medical decision making, I reviewed the following data within the electronic MEDICAL RECORD NUMBER Nursing notes reviewed and incorporated, Labs reviewed , EKG interpreted , Old EKG reviewed, Old chart reviewed, Patient signed out to Dr. Derrill Kay and reviewed Notes from prior ED visits   Differential diagnosis includes, but is not limited to, medication or drug side effect, metabolic or electrolyte abnormality, acute intra-abdominal infection such as biliary colic or appendicitis or pancreatitis, ovarian pathology such as torsion, SBO/ileus.  The patient had stable vital signs upon arrival and complained of abdominal pain and was requesting narcotic analgesia and then fell asleep.  Since she woke up she began asking for morphine by name but it is very difficult to obtain a reliable exam.  Her vital signs remained stable and she is not tachycardic.  I suspect that polysubstance abuse is a substantial component of her current presentation.  However now that she is able to give more history I will check basic lab work.  She was requesting pain medicine but was also asking for something to drink  so we will give her some ginger ale and Tylenol 1000 mg by mouth as a p.o. challenge.  No indication for involuntary commitment or other psychiatric work-up at this moment       Clinical Course as of 10/06/20 7353  Sun Oct 06, 2020  0716 Transferring ED care to Dr. Derrill Kay to reassess after medication and determine if any additional work-up or imaging is needed or if symptomatic control is appropriate.  Patient just vomited copiously on the floor after drinking some ginger ale and taking Tylenol.  I was going to order droperidol 5 mg intramuscular but I reviewed an old EKG which demonstrated some degree of QTC prolongation.  I ordered an EKG for today and Dr. Derrill Kay will review it to determine if there is an appropriate antiemetic. [CF]  0719 Fortunately the patient's EKG does not  show a substantial elevated QTC as did the one from December and I am comfortable proceeding with the droperidol 5 mg intramuscular as I think this will be very helpful for her not only for the nausea and vomiting but as a calming agent in the setting of recent cocaine use.  The risk of arrhythmia is very small and the therapeutic benefit is potentially quite high for this patient [CF]    Clinical Course User Index [CF] Loleta Rose, MD     ____________________________________________  FINAL CLINICAL IMPRESSION(S) / ED DIAGNOSES  Final diagnoses:  Cocaine abuse (HCC)     MEDICATIONS GIVEN DURING THIS VISIT:  Medications  droperidol (INAPSINE) 2.5 MG/ML injection 5 mg (has no administration in time range)  acetaminophen (TYLENOL) tablet 1,000 mg (1,000 mg Oral Given 10/06/20 0631)     ED Discharge Orders    None      *Please note:  Erica Park was evaluated in Emergency Department on 10/06/2020 for the symptoms described in the history of present illness. She was evaluated in the context of the global COVID-19 pandemic, which necessitated consideration that the patient might be at risk for infection with the SARS-CoV-2 virus that causes COVID-19. Institutional protocols and algorithms that pertain to the evaluation of patients at risk for COVID-19 are in a state of rapid change based on information released by regulatory bodies including the CDC and federal and state organizations. These policies and algorithms were followed during the patient's care in the ED.  Some ED evaluations and interventions may be delayed as a result of limited staffing during and after the pandemic.*  Note:  This document was prepared using Dragon voice recognition software and may include unintentional dictation errors.   Loleta Rose, MD 10/06/20 [phone]

## 2020-10-06 NOTE — ED Notes (Signed)
Report from [place], [place]. Pt currently sleeping at this time. NAD noted. Dr. Derrill Kay trying to assess pt but pt appears very sleepy at this time due to IM injection this morning.

## 2020-10-06 NOTE — ED Notes (Signed)
Spoke with Dr. Derrill Kay regarding pt's plan of care. Dr. Derrill Kay, will reevaluate her. Pt has been sleeping since the droperidol. Will reassess vital signs when pt is awake.

## 2020-10-06 NOTE — ED Notes (Signed)
Pt still sleeping at this time. 

## 2020-10-06 NOTE — ED Notes (Signed)
Mom here to take pt. home. Pt refuses D/C vitals. Shows given to pt. NAD noted. Pt ambulatory with steady gait.

## 2020-10-06 NOTE — ED Triage Notes (Signed)
Pt arrived via [facility] due to her whole body hurting and requesting pain medication. Pt reports she was dropped off today at home by mother and that pt has been smoking crack cocaine since 3/3. Per EMS, pt refused IV and became argumentative and uncooperative during route to ED.

## 2020-10-06 NOTE — ED Notes (Signed)
Pt vomiting at this time. Dr. York Cerise, MD made aware. Pt yelling out requesting pain medication. Pt states, "I need morphine and I need something stronger than Tylenol and Ibuprofen." Explained to pt that Dr. York Cerise was not giving her any more pain medication at this time.

## 2020-10-06 NOTE — ED Provider Notes (Signed)
-----------------------------------------   4:40 PM on 10/06/2020 -----------------------------------------  Patient is now awake and alert.  Patient denies any abdominal pain or any other complaints at this time.  Patient has eaten, she has drank 2 glasses of water.  Patient's lab work is reassuring.  Mom is here to pick the patient up.  Patient agreeable to this plan of care.   Minna Antis, MD 10/06/20 1642

## 2020-10-06 NOTE — ED Notes (Signed)
Patient refusing to speak at this time.

## 2020-10-06 NOTE — ED Notes (Signed)
Pt refusing vitals at this time. Pt denies pain. Pt able to eat crackers and hold them down as well as ginger ale.

## 2020-10-06 NOTE — ED Notes (Signed)
Patient is awake and asking to speak to the doctor.  Provider made aware.

## 2020-10-06 NOTE — ED Notes (Signed)
Pt is currently sleeping at this time. Seems that medication was helping her

## 2021-10-13 IMAGING — DX DG ABDOMEN 2V
2 series · 2 of 2 positions shown · non-contrast
Comparison: CT scan 07/27/2020.

CLINICAL DATA: Small bowel obstruction.

EXAM:
ABDOMEN - 2 VIEW

[abdomen erect]
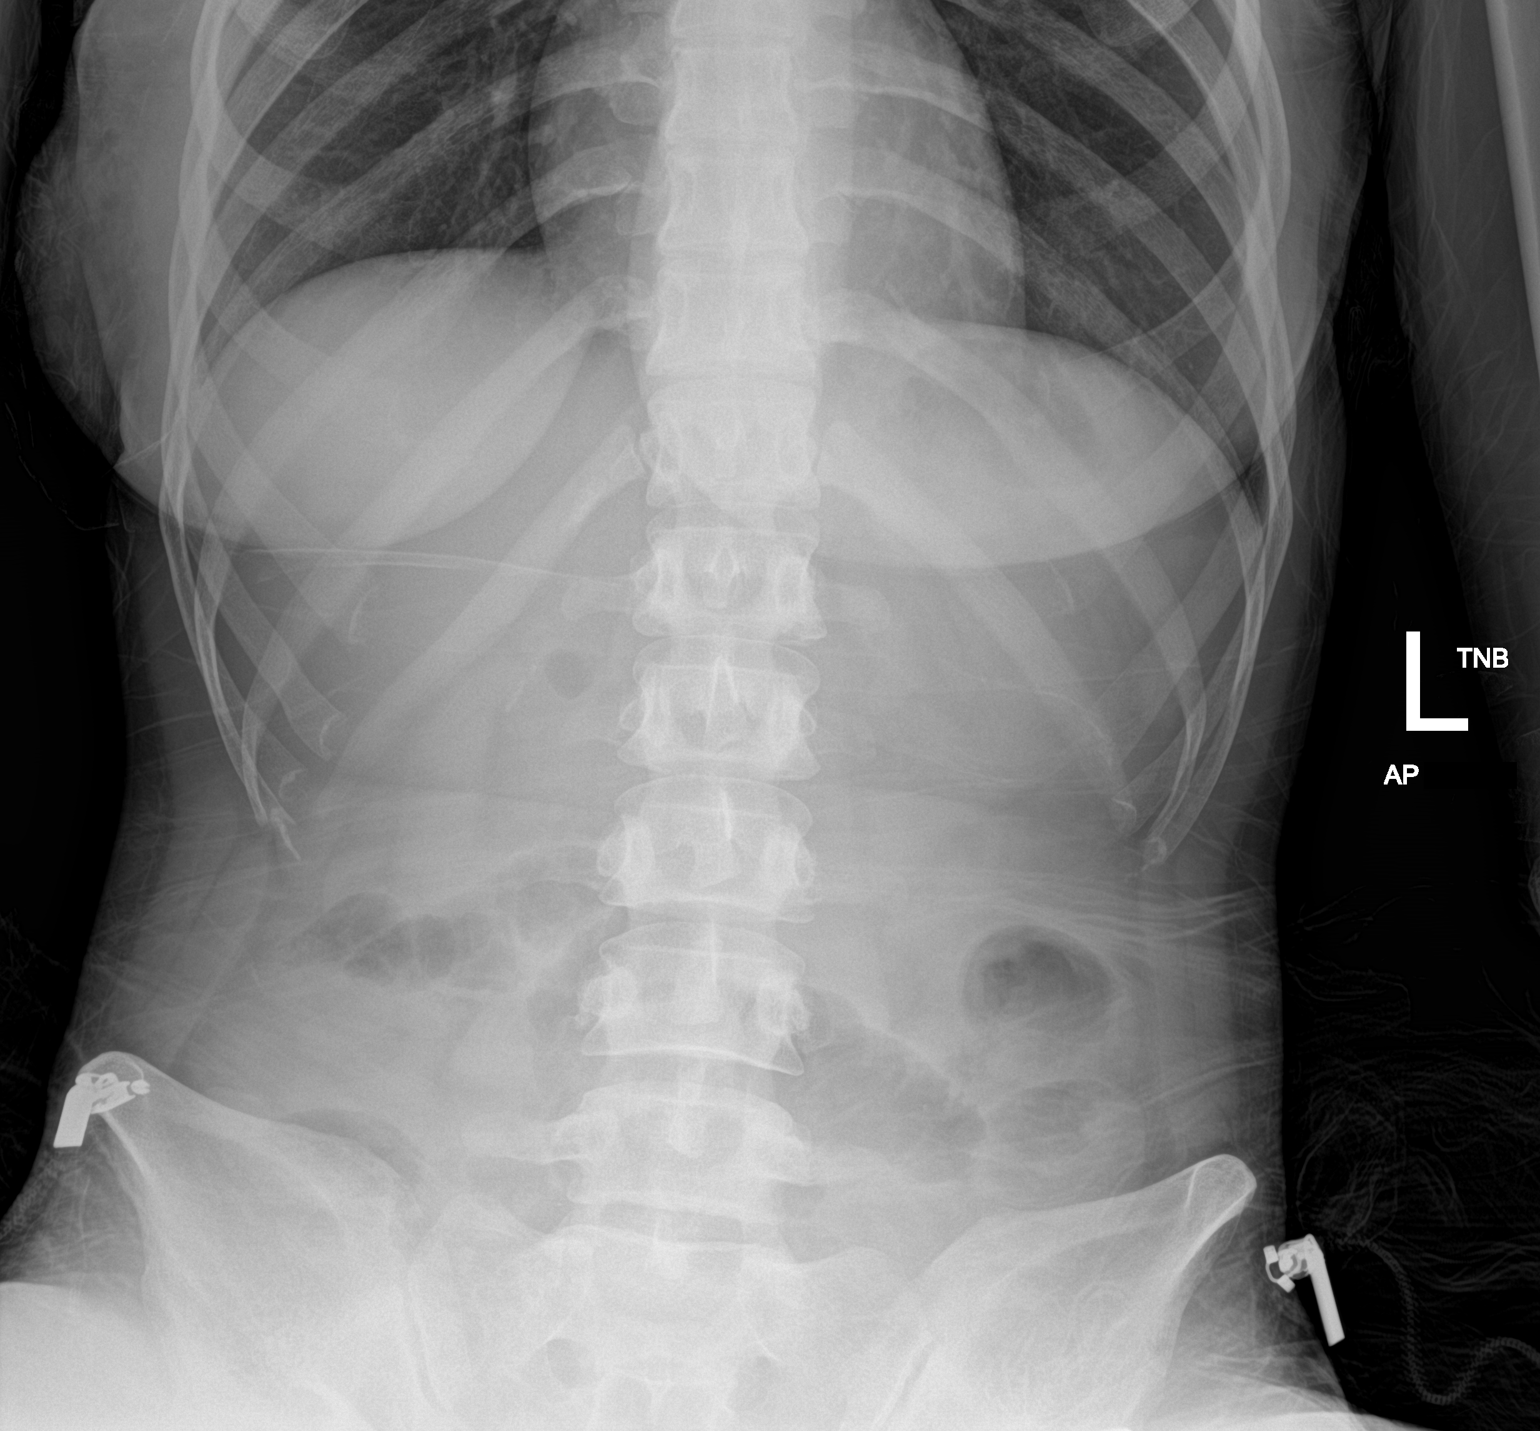

[abdomen supine]
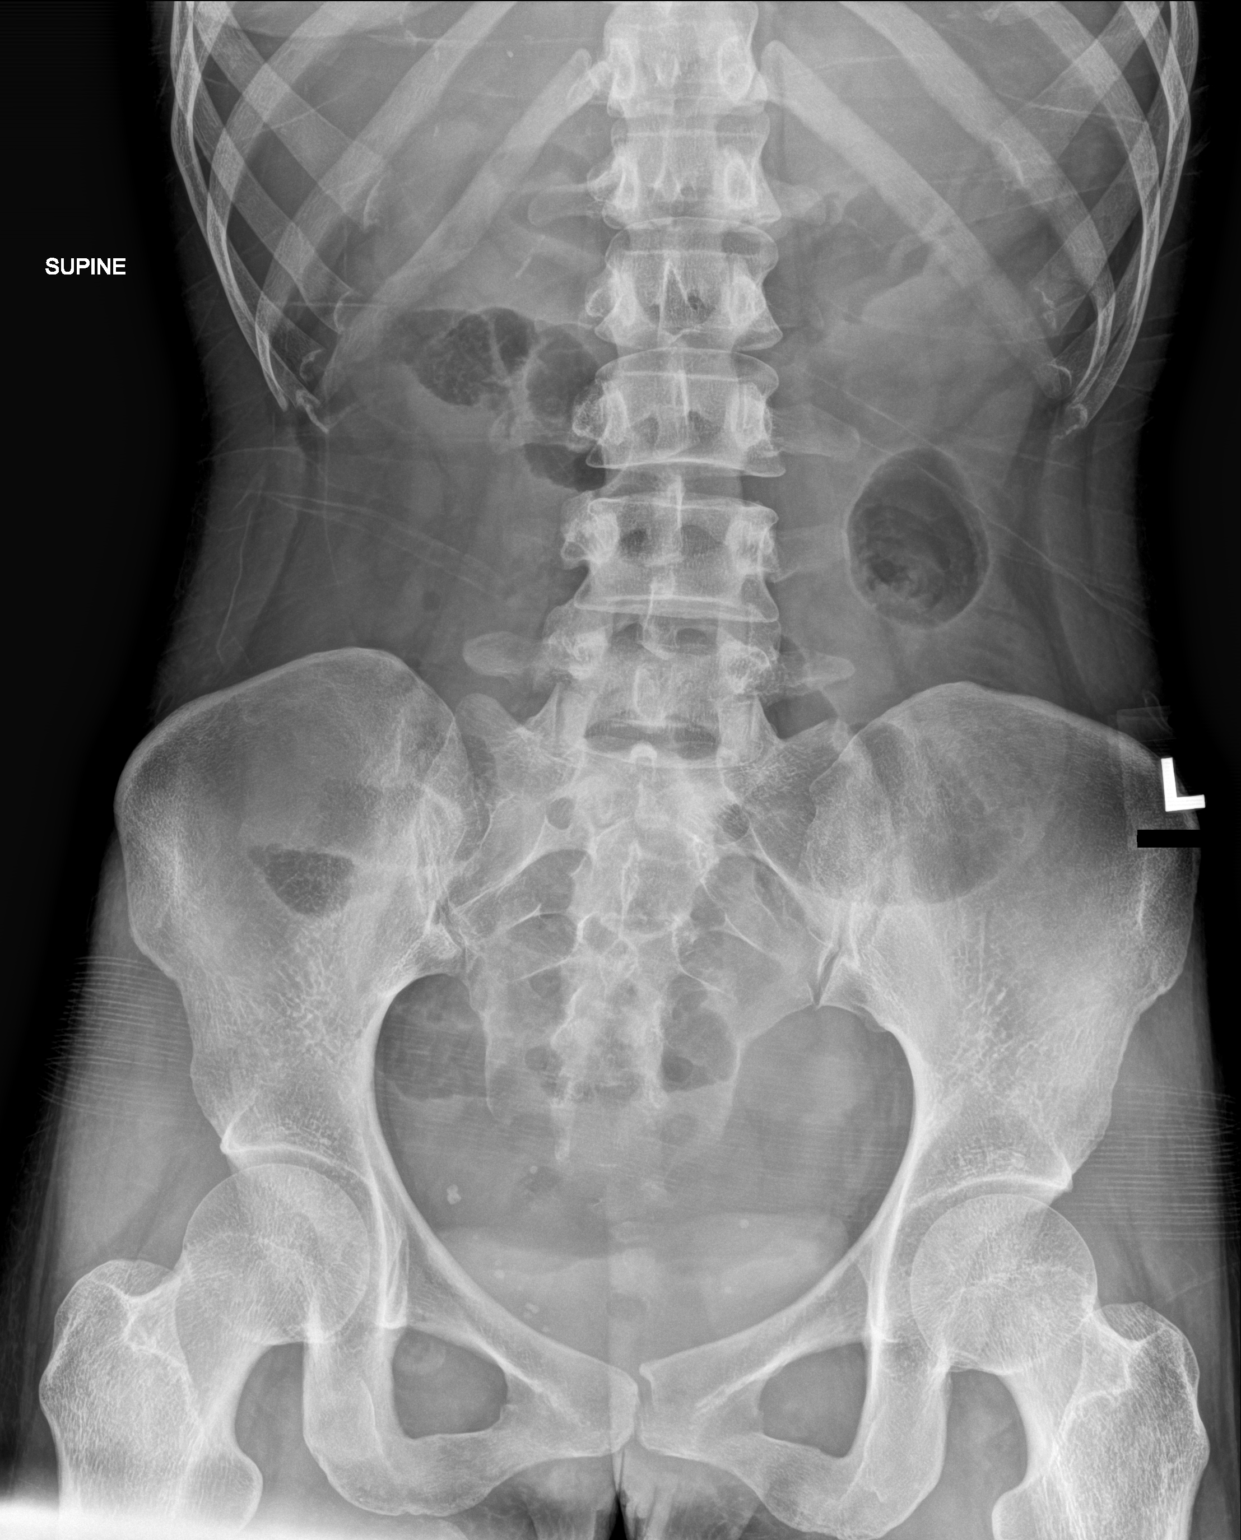

[2 of 2 positions shown; findings below may reference images not displayed]

FINDINGS: Upright film shows no evidence for intraperitoneal free air. Mild
small bowel distention in the left abdomen measures up to 4.3 cm.
Paucity of colonic gas again noted. Visualized bony anatomy
unremarkable.
IMPRESSION: Persistent small bowel dilatation in the left abdomen with paucity
of colonic gas.
